# Patient Record
Sex: Male | Born: 1970 | Race: Black or African American | Hispanic: No | Smoking: Current every day smoker
Health system: Southern US, Community
[De-identification: ages and names within clinical notes are randomized; demographics above are authoritative.]

## PROBLEM LIST (undated history)

## (undated) HISTORY — PX: APPENDECTOMY: SHX54

---

## 2012-11-28 ENCOUNTER — Emergency Department: Payer: Self-pay | Admitting: Emergency Medicine

## 2012-11-30 ENCOUNTER — Ambulatory Visit: Payer: Self-pay | Admitting: Family Medicine

## 2012-12-01 ENCOUNTER — Ambulatory Visit: Payer: Self-pay | Admitting: Family Medicine

## 2012-12-01 IMAGING — CT CT CERVICAL SPINE WITHOUT CONTRAST
1 series · 12 of 14 positions shown, 15 images · non-contrast
Comparison: none

REASON FOR EXAM: CALL REPORT  [PHONE_NUMBER]  abn bone xray
COMMENTS:

PROCEDURE:     CT  - CT CERVICAL SPINE WO  - [DATE] [DATE]
RESULT:     CT cervical spine
TECHNIQUE: Helical 2 mm sections were obtained. Reconstructions were
performed utilizing a bone algorithm in coronal, sagittal, and axial planes.

[Series 6: axial · axial · 0.31mm/px · z∈[-165,-38]mm · 12 of 82 slices shown, 15 images]
[im 7/82  soft-tissue]
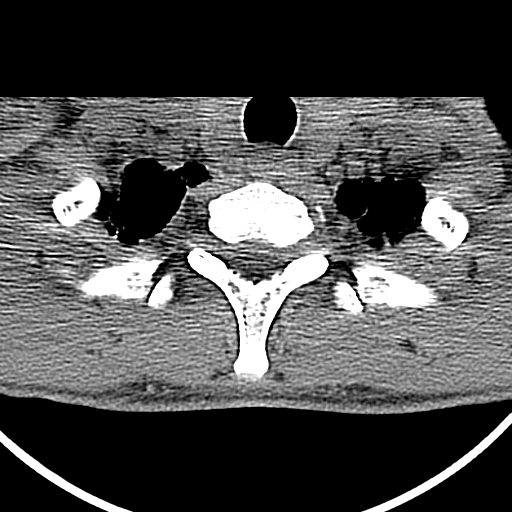
[im 7/82  bone]
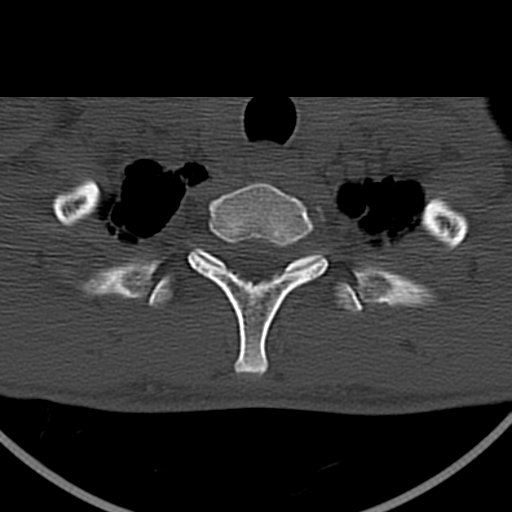
[im 13/82  bone]
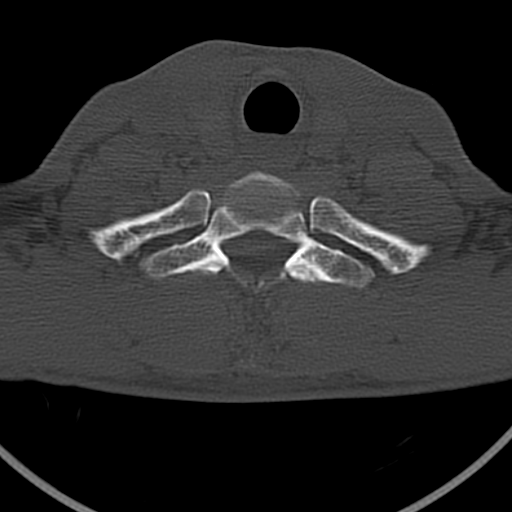
[im 19/82  bone]
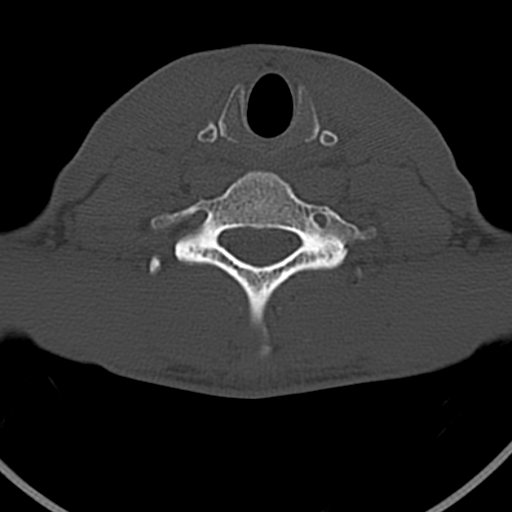
[im 25/82  bone]
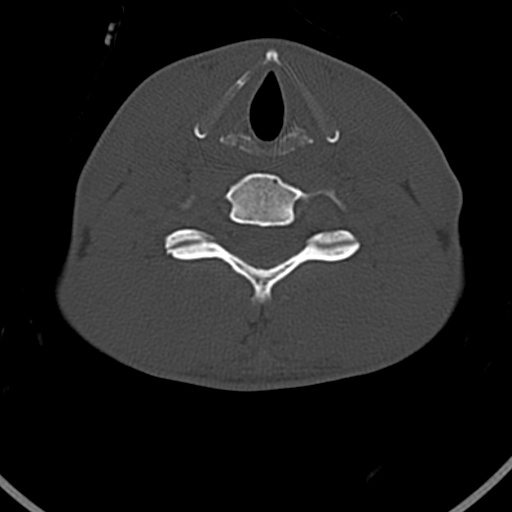
[im 32/82  soft-tissue]
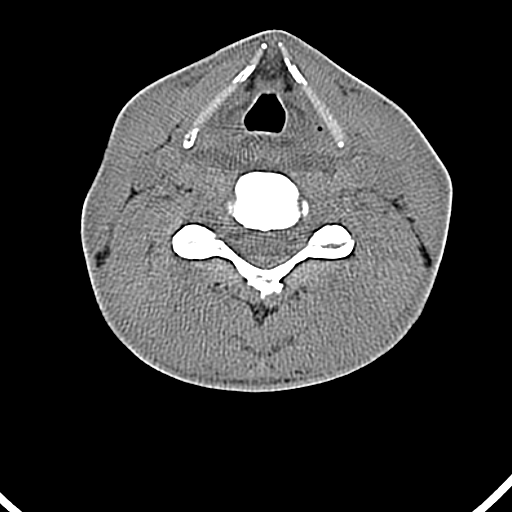
[im 32/82  bone]
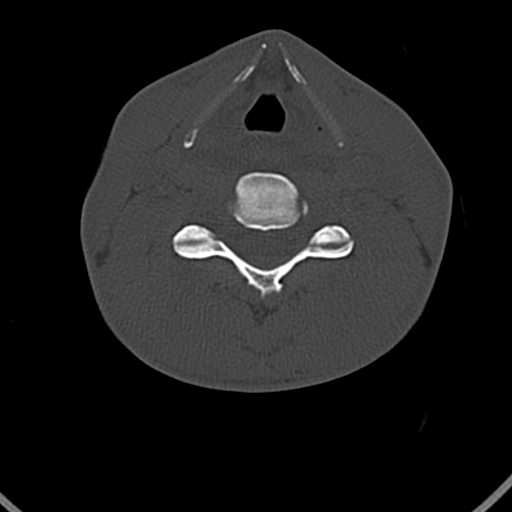
[im 38/82  bone]
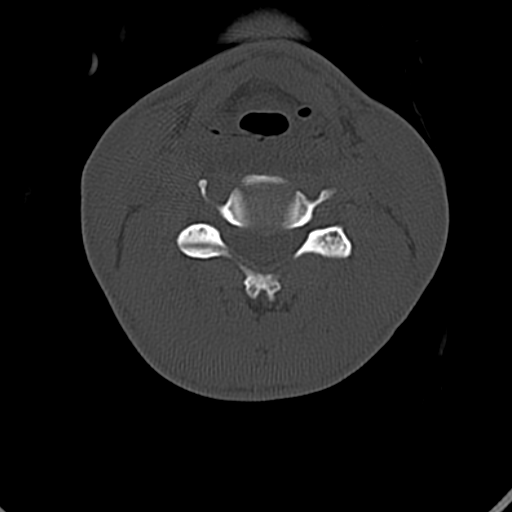
[im 44/82  bone]
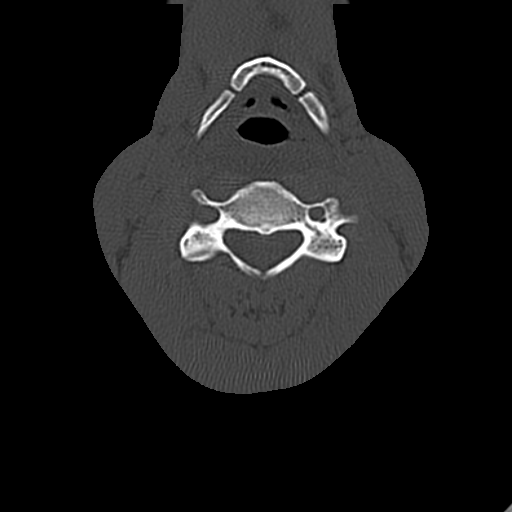
[im 50/82  bone]
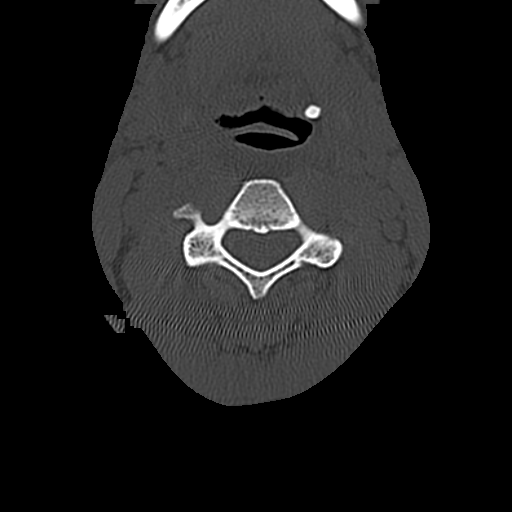
[im 57/82  soft-tissue]
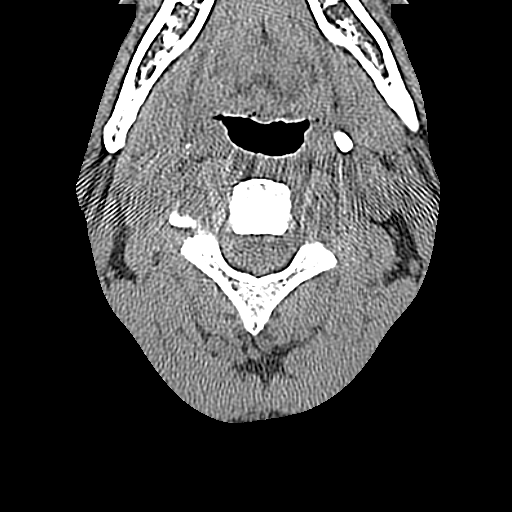
[im 57/82  bone]
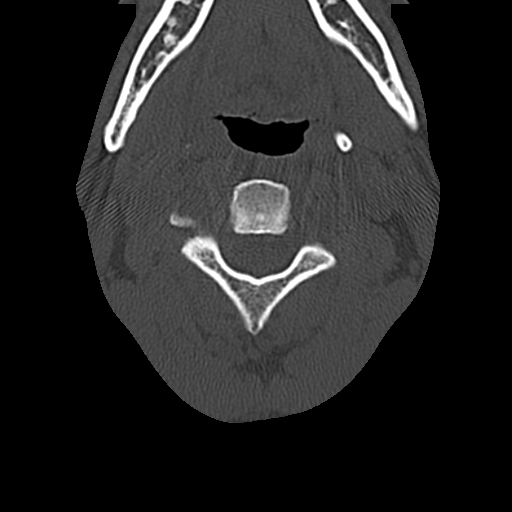
[im 63/82  bone]
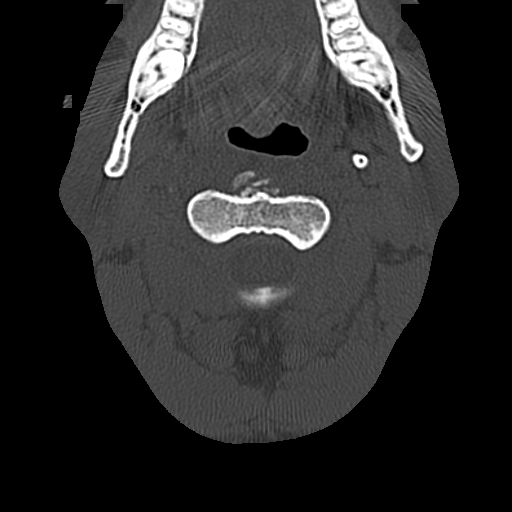
[im 69/82  bone]
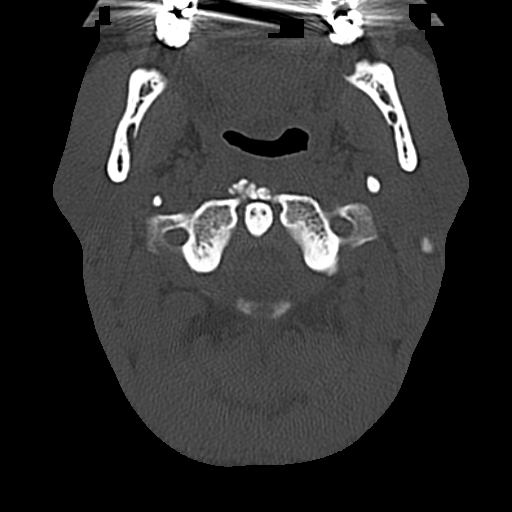
[im 75/82  bone]
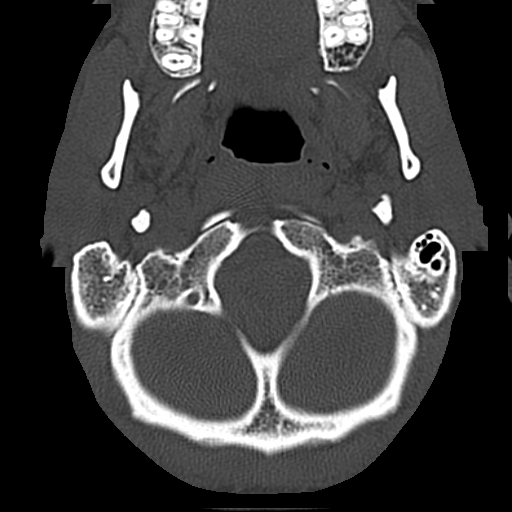

[12 of 14 positions shown; findings below may reference images not displayed]

FINDINGS: There is no evidence of acute fracture, dislocation, or
malalignment. Amorphous area of calcified density projects along the
anterior aspect of the dens just below the anterior ring of C1. This finding
has the appearance of acute calcific tendinitis of the longus coli
musculature. There appears to be mild edema anterior to this finding within
the soft tissues. There is no evidence of tracheal stenosis.
IMPRESSION: 1. Findings consistent with acute calcific tendinitis of the longus coli
musculature.
2. Otherwise no evidence of acute osseous abnormalities.

## 2012-12-10 ENCOUNTER — Encounter: Payer: Self-pay | Admitting: Family Medicine

## 2012-12-18 ENCOUNTER — Encounter: Payer: Self-pay | Admitting: Family Medicine

## 2014-11-01 LAB — URINALYSIS, COMPLETE
Bacteria: NONE SEEN
Bilirubin,UR: NEGATIVE
Blood: NEGATIVE
Glucose,UR: NEGATIVE mg/dL (ref 0–75)
LEUKOCYTE ESTERASE: NEGATIVE
NITRITE: NEGATIVE
PH: 6 (ref 4.5–8.0)
PROTEIN: NEGATIVE
RBC,UR: 1 /HPF (ref 0–5)
SQUAMOUS EPITHELIAL: NONE SEEN
Specific Gravity: 1.025 (ref 1.003–1.030)

## 2014-11-01 LAB — COMPREHENSIVE METABOLIC PANEL
ALBUMIN: 3.8 g/dL (ref 3.4–5.0)
AST: 21 U/L (ref 15–37)
Alkaline Phosphatase: 71 U/L
Anion Gap: 6 — ABNORMAL LOW (ref 7–16)
BUN: 10 mg/dL (ref 7–18)
Bilirubin,Total: 0.6 mg/dL (ref 0.2–1.0)
CREATININE: 1.12 mg/dL (ref 0.60–1.30)
Calcium, Total: 8.6 mg/dL (ref 8.5–10.1)
Chloride: 106 mmol/L (ref 98–107)
Co2: 26 mmol/L (ref 21–32)
EGFR (African American): >60
EGFR (Non-African Amer.): >60
GLUCOSE: 99 mg/dL (ref 65–99)
Osmolality: 275 (ref 275–301)
POTASSIUM: 4.1 mmol/L (ref 3.5–5.1)
SGPT (ALT): 13 U/L — ABNORMAL LOW
SODIUM: 138 mmol/L (ref 136–145)
TOTAL PROTEIN: 7.1 g/dL (ref 6.4–8.2)

## 2014-11-01 LAB — CBC WITH DIFFERENTIAL/PLATELET
Basophil #: 0.1 10*3/uL (ref 0.0–0.1)
Basophil %: 0.6 %
EOS ABS: 0 10*3/uL (ref 0.0–0.7)
Eosinophil %: 0.2 %
HCT: 44.9 % (ref 40.0–52.0)
HGB: 14.3 g/dL (ref 13.0–18.0)
LYMPHS ABS: 1.1 10*3/uL (ref 1.0–3.6)
Lymphocyte %: 6.7 %
MCH: 27.8 pg (ref 26.0–34.0)
MCHC: 31.9 g/dL — ABNORMAL LOW (ref 32.0–36.0)
MCV: 87 fL (ref 80–100)
Monocyte #: 0.8 x10 3/mm (ref 0.2–1.0)
Monocyte %: 5.2 %
NEUTROS ABS: 14.4 10*3/uL — AB (ref 1.4–6.5)
Neutrophil %: 87.3 %
Platelet: 162 10*3/uL (ref 150–440)
RBC: 5.14 10*6/uL (ref 4.40–5.90)
RDW: 14.2 % (ref 11.5–14.5)
WBC: 16.5 10*3/uL — ABNORMAL HIGH (ref 3.8–10.6)

## 2014-11-01 LAB — LIPASE, BLOOD: Lipase: 52 U/L — ABNORMAL LOW (ref 73–393)

## 2014-11-02 ENCOUNTER — Observation Stay: Payer: Self-pay | Admitting: Emergency Medicine

## 2015-02-12 LAB — SURGICAL PATHOLOGY

## 2015-02-18 NOTE — H&P (Signed)
   Subjective/Chief Complaint RLQ pain, N/V x 1 day   History of Present Illness 44 yo M who presents with a 1 day history of RLQ pain.  Began acutely, awoke from sleep.  Worsening.  + nausea/vomiting.  + fevers/chills.  Doing well prior to this.  Has never had this pain before.   Code Status Full Code   Past Med/Surgical Hx:  Denies medical history:   Denies surgical history.:   ALLERGIES:  No Known Allergies:   Family and Social History:  Family History Negative   Social History negative tobacco, negative ETOH, negative Illicit drugs   Place of Living Home   Review of Systems:  Subjective/Chief Complaint RLQ pain, N/V.   Fever/Chills Yes   Cough No   Sputum No   Abdominal Pain Yes   Diarrhea No   Constipation No   Nausea/Vomiting Yes   SOB/DOE No   Dysuria No   Tolerating Diet No  Nauseated  Vomiting   Physical Exam:  GEN well developed, well nourished, no acute distress   HEENT pink conjunctivae, PERRL, hearing intact to voice, good dentition   RESP normal resp effort  clear BS   CARD regular rate  no murmur  no thrills   ABD positive tenderness  no hernia  soft  rigid  distended  normal BS  no Abdominal Bruits  no Adominal Mass   EXTR negative cyanosis/clubbing, negative edema   SKIN normal to palpation, No rashes, skin turgor good   NEURO cranial nerves intact, follows commands, strength:, motor/sensory function intact   PSYCH A+O to time, place, person, good insight    Assessment/Admission Diagnosis 44 yo with RLQ pain, N/V.  WBC 16.  CT scan concerning for appendicitis.   Plan NPO, IVF, IV abx.  To or for lap appendectomy   Electronic Signatures: Jarvis NewcomerLundquist, Herman Fiero A (MD)  (Signed 13-Jan-16 20:41)  Authored: CHIEF COMPLAINT and HISTORY, PAST MEDICAL/SURGIAL HISTORY, ALLERGIES, FAMILY AND SOCIAL HISTORY, REVIEW OF SYSTEMS, PHYSICAL EXAM, ASSESSMENT AND PLAN   Last Updated: 13-Jan-16 20:41 by Jarvis NewcomerLundquist, Jamaris Theard A (MD)

## 2015-02-18 NOTE — Op Note (Signed)
PATIENT NAME:  Johnathan Dennis, Johnathan Dennis DATE OF BIRTH:  03/31/1971  DATE OF PROCEDURE:  11/01/2014  PREOPERATIVE DIAGNOSIS: Acute appendicitis.   POSTOPERATIVE DIAGNOSIS: Acute appendicitis.   PROCEDURE PERFORMED: Laparoscopic appendectomy.   ANESTHESIA: General.   ESTIMATED BLOOD LOSS: 10 mL   COMPLICATIONS: None.   SPECIMENS: Appendix.   INDICATION FOR SURGERY: Johnathan Dennis is a pleasant male with history of right lower quadrant pain, leukocytosis and a CT scan concerning for appendicitis. He was thus brought to the operating room for laparoscopic appendectomy.   DETAILS OF PROCEDURE: Obtained. Johnathan Dennis was brought to the operating room suite. He was induced. Endotracheal tube was placed, general anesthesia was administered. His abdomen was prepped and draped in standard surgical fashion. A timeout was then performed correctly identifying patient name, operative site and procedure to be formed.   A supraumbilical incision was made. It was deepened down to the fascia. The fascia was incised. The peritoneum was entered. Two stay sutures were placed through the fasciotomy. A left lower quadrant and suprapubic 5 mm trocars were placed. The appendix was visualized. It was stuck to the abdominal wall and appeared to be inflamed and enlarged. Defect was made in the mesoappendix. The stapler was then fired across the base of the mesoappendix flush with the cecum. The stapler was then fired across the mesoappendix. The appendix was then taken out  with an EndoCatch bag through the umbilicus. The abdomen was irrigated.   Hemostasis was obtained. The trocars were then taken out. The abdomen was desufflated. The supraumbilical fascia was closed with figure-of-eight 0 Vicryl. The skin was then closed with 4-0 Monocryl deep dermal sutures. Steri-Strips, Telfa gauze and Tegaderm were then used to complete the dressing.   The patient was then awoken, extubated and brought to the  postanesthesia care unit. There were no immediate complications.   Needle, sponge, and instrument counts were correct at the end of the procedure.     ____________________________ Si Raiderhristopher A. Madell Heino, MD cal:ST D: 11/06/2014 15:26:45 ET T: 11/06/2014 15:59:38 ET JOB#: [id number]  cc: Cristal Deerhristopher A. Hulbert Branscome, MD, <Dictator> Jarvis NewcomerHRISTOPHER A Kellan Boehlke MD ELECTRONICALLY SIGNED 11/08/2014 8:43

## 2015-06-14 ENCOUNTER — Encounter: Payer: Self-pay | Admitting: Family Medicine

## 2017-06-20 ENCOUNTER — Encounter: Payer: Self-pay | Admitting: Emergency Medicine

## 2017-06-20 ENCOUNTER — Emergency Department
Admission: EM | Admit: 2017-06-20 | Discharge: 2017-06-20 | Disposition: A | Discharge status: Home / Self Care | Payer: Self-pay | Attending: Student in an Organized Health Care Education/Training Program | Admitting: Student in an Organized Health Care Education/Training Program

## 2017-06-20 ENCOUNTER — Emergency Department: Payer: Self-pay

## 2017-06-20 DIAGNOSIS — Y999 Unspecified external cause status: Secondary | ICD-10-CM | POA: Insufficient documentation

## 2017-06-20 DIAGNOSIS — S60012A Contusion of left thumb without damage to nail, initial encounter: Secondary | ICD-10-CM | POA: Insufficient documentation

## 2017-06-20 DIAGNOSIS — W230XXA Caught, crushed, jammed, or pinched between moving objects, initial encounter: Secondary | ICD-10-CM | POA: Insufficient documentation

## 2017-06-20 DIAGNOSIS — Y939 Activity, unspecified: Secondary | ICD-10-CM | POA: Insufficient documentation

## 2017-06-20 DIAGNOSIS — Y929 Unspecified place or not applicable: Secondary | ICD-10-CM | POA: Insufficient documentation

## 2017-06-20 DIAGNOSIS — F17210 Nicotine dependence, cigarettes, uncomplicated: Secondary | ICD-10-CM | POA: Insufficient documentation

## 2017-06-20 MED ORDER — IBUPROFEN 800 MG PO TABS: 800.0000 mg | ORAL_TABLET | Freq: Three times a day (TID) | ORAL | 0 refills | Status: DC | PRN

## 2017-06-20 MED ORDER — IBUPROFEN 800 MG PO TABS
800.0000 mg | ORAL_TABLET | Freq: Once | ORAL | Status: AC
  Administered 2017-06-20: 800 mg via ORAL
  Filled 2017-06-20: qty 1

## 2017-06-20 NOTE — ED Notes (Signed)
PA at the bedside for pt evaluation 

## 2017-06-20 NOTE — ED Notes (Signed)

## 2017-06-20 NOTE — ED Triage Notes (Signed)
Pt says he slammed the end of his left thumb in the car door around 10am today; some bruising under nailbed; pt says finger is throbbing and burning;

## 2017-06-20 NOTE — Discharge Instructions (Signed)
Utilize splint to protect thumb until pain improves.  Take ibuprofen for pain and inflammation as directed.

## 2017-06-20 NOTE — ED Notes (Signed)
X-ray at the beside.

## 2017-06-20 NOTE — ED Provider Notes (Signed)
[facility] Emergency Department Provider Note   ____________________________________________   I have reviewed the triage vital signs and the nursing notes.   HISTORY  Chief Complaint Finger Injury    HPI Johnathan Dennis is a 46 y.o. male presents to emergency department with left thumb pain after slamming his thumb in [employer] [place] earlier this today. Patient reports swelling, pain along the distal phalanx of the left thumb. Patient demonstrates movement and strength along the left thumb. No deformities or open wounds noted. Patient denies fever, chills, headache, vision changes, chest pain, chest tightness, shortness of breath, abdominal pain, nausea and vomiting.  History reviewed. No pertinent past medical history.  There are no active problems to display for this patient.   History reviewed. No pertinent surgical history.  Prior to Admission medications   Medication Sig Start Date End Date Taking? Authorizing Provider  ibuprofen (ADVIL,MOTRIN) 800 MG tablet Take 1 tablet (800 mg total) by mouth every 8 (eight) hours as needed. 06/20/17   Royale Swamy M, PA-C    Allergies Patient has no known allergies.  History reviewed. No pertinent family history.  Social History Social History  Substance Use Topics  . Smoking status: Current Every Day Smoker    Packs/day: 1.00    Types: Cigarettes  . Smokeless tobacco: Never Used  . Alcohol use No    Review of Systems Constitutional: Negative for fever/chills Eyes: No visual changes. ENT:  Negative for sore throat and for difficulty swallowing Cardiovascular: Denies chest pain. Respiratory: Denies cough. Denies shortness of breath. Gastrointestinal: No abdominal pain.  No nausea, vomiting, diarrhea. Genitourinary: Negative for dysuria. Musculoskeletal: Positive for thumb pain. Skin: Negative for rash. Neurological: Negative for headaches.  Negative focal weakness or numbness. Negative  for loss of consciousness. Able to ambulate. ____________________________________________   PHYSICAL EXAM:  VITAL SIGNS: ED Triage Vitals  Enc Vitals Group     BP 06/20/17 2021 122/83     Pulse Rate 06/20/17 2021 66     Resp 06/20/17 2021 18     Temp 06/20/17 2021 97.8 F (36.6 C)     Temp Source 06/20/17 2021 Oral     SpO2 06/20/17 2021 99 %     Weight 06/20/17 2022 135 lb (61.2 kg)     Height 06/20/17 2022 5\' 7"  (1.702 m)     Head Circumference --      Peak Flow --      Pain Score 06/20/17 2020 7     Pain Loc --      Pain Edu? --      Excl. in GC? --     Constitutional: Alert and oriented. Well appearing and in no acute distress.  Eyes: Conjunctivae are normal. PERRL. EOMI  Head: Normocephalic and atraumatic. ENT:      Ears: Canals clear. TMs intact bilaterally.      Nose: No congestion/rhinnorhea.      Mouth/Throat: Mucous membranes are moist. Neck:Supple. No thyromegaly. No stridor.  Cardiovascular: Normal rate, regular rhythm. Normal S1 and S2.  Good peripheral circulation. Respiratory: Normal respiratory effort without tachypnea or retractions. Lungs CTAB. No wheezes/rales/rhonchi. Good air entry to the bases with no decreased or absent breath sounds. Hematological/Lymphatic/Immunological: No cervical lymphadenopathy. Cardiovascular: Normal rate, regular rhythm. Normal distal pulses. Gastrointestinal: Bowel sounds 4 quadrants. Soft and nontender to palpation. No guarding or rigidity. No palpable masses. No distention. No CVA tenderness. Musculoskeletal: Left thumb pain along distal phalanx. Intact ROM and tendons. Intact sensation.  Neurologic:  Normal speech and language.  Skin:  Skin is warm, dry and intact. No rash noted. Psychiatric: Mood and affect are normal. Speech and behavior are normal. Patient exhibits appropriate insight and judgement.  ____________________________________________   LABS (all labs ordered are listed, but only abnormal results are  displayed)  Labs Reviewed - No data to display ____________________________________________  EKG none ____________________________________________  RADIOLOGY DG hand complete FINDINGS: There is no evidence of fracture or dislocation. There is no evidence of arthropathy or other focal bone abnormality. Soft tissues are unremarkable.  IMPRESSION: Negative. ____________________________________________   PROCEDURES  Procedure(s) performed:  Applied splint to thumb for support with tape.     Critical Care performed: no ____________________________________________   INITIAL IMPRESSION / ASSESSMENT AND PLAN / ED COURSE  Pertinent labs & imaging results that were available during my care of the patient were reviewed by me and considered in my medical decision making (see chart for details).  Patient presented with left thumb pain. Patient history, physical exam findings and imaging are reassuring of no acute fracture or neurovascular injury. Symptoms consistent with left thumb contusion. Applied splint for comfort and advised to remove with symptoms improved. Patient advised to follow up with PCP as needed and was also advised to return to the emergency department for symptoms that change or worsen. Patient informed of clinical course, understand medical decision-making process, and agree with plan. ____________________________________________   FINAL CLINICAL IMPRESSION(S) / ED DIAGNOSES  Final diagnoses:  Contusion of left thumb without damage to nail, initial encounter       NEW MEDICATIONS STARTED DURING THIS VISIT:  New Prescriptions   IBUPROFEN (ADVIL,MOTRIN) 800 MG TABLET    Take 1 tablet (800 mg total) by mouth every 8 (eight) hours as needed.     Note:  This document was prepared using Dragon voice recognition software and may include unintentional dictation errors.    Percell Boston 06/20/17 2142    Willy Eddy, MD 06/20/17 2147

## 2018-05-28 ENCOUNTER — Emergency Department: Payer: 59

## 2018-05-28 ENCOUNTER — Other Ambulatory Visit: Payer: Self-pay

## 2018-05-28 ENCOUNTER — Emergency Department
Admission: EM | Admit: 2018-05-28 | Discharge: 2018-05-29 | Disposition: A | Discharge status: Home / Self Care | Payer: 59 | Attending: Emergency Medicine | Admitting: Emergency Medicine

## 2018-05-28 DIAGNOSIS — R0789 Other chest pain: Secondary | ICD-10-CM | POA: Insufficient documentation

## 2018-05-28 DIAGNOSIS — F17210 Nicotine dependence, cigarettes, uncomplicated: Secondary | ICD-10-CM | POA: Diagnosis not present

## 2018-05-28 DIAGNOSIS — R079 Chest pain, unspecified: Secondary | ICD-10-CM | POA: Diagnosis present

## 2018-05-28 LAB — CBC
HEMATOCRIT: 44.2 % (ref 40.0–52.0)
Hemoglobin: 14.9 g/dL (ref 13.0–18.0)
MCH: 28.8 pg (ref 26.0–34.0)
MCHC: 33.8 g/dL (ref 32.0–36.0)
MCV: 85.3 fL (ref 80.0–100.0)
PLATELETS: 179 10*3/uL (ref 150–440)
RBC: 5.19 MIL/uL (ref 4.40–5.90)
RDW: 13.7 % (ref 11.5–14.5)
WBC: 6 10*3/uL (ref 3.8–10.6)

## 2018-05-28 LAB — BASIC METABOLIC PANEL
Anion gap: 8 (ref 5–15)
BUN: 11 mg/dL (ref 6–20)
CO2: 27 mmol/L (ref 22–32)
Calcium: 9 mg/dL (ref 8.9–10.3)
Chloride: 102 mmol/L (ref 98–111)
Creatinine, Ser: 1.41 mg/dL — ABNORMAL HIGH (ref 0.61–1.24)
GFR, EST NON AFRICAN AMERICAN: 59 mL/min — AB (ref >60)
Glucose, Bld: 106 mg/dL — ABNORMAL HIGH (ref 70–99)
POTASSIUM: 3.9 mmol/L (ref 3.5–5.1)
SODIUM: 137 mmol/L (ref 135–145)

## 2018-05-28 LAB — TROPONIN I: Troponin I: <0.03 ng/mL (ref <0.03)

## 2018-05-28 NOTE — ED Notes (Signed)
ED Provider at bedside. 

## 2018-05-28 NOTE — ED Provider Notes (Signed)
[facility] Emergency Department Provider Note  ____________________________________________   First MD Initiated Contact with Patient 05/28/18 2346     (approximate)  I have reviewed the triage vital signs and the nursing notes.   HISTORY  Chief Complaint Chest Pain    HPI Humzah Ashok CroonJ Hare is a 47 y.o. male who self presents the emergency department with 3 days of chest pain.  Pain is sharp and intermittent and he feels like it is "fluttering" in his left chest.  Pain last 2 to 3 seconds at a time.  No shortness of breath.  No nausea vomiting.  Nonexertional.  No history of DVT or pulmonary embolism.  No leg swelling.  No recent surgery travel or immobilization.  Pain is not ripping or tearing does not go straight to his back.    No past medical history on file.  There are no active problems to display for this patient.   No past surgical history on file.  Prior to Admission medications   Medication Sig Start Date End Date Taking? Authorizing Provider  ibuprofen (ADVIL,MOTRIN) 800 MG tablet Take 1 tablet (800 mg total) by mouth every 8 (eight) hours as needed. 06/20/17   Little, Traci M, PA-C    Allergies Patient has no known allergies.  No family history on file.  Social History Social History   Tobacco Use  . Smoking status: Current Every Day Smoker    Packs/day: 1.00    Types: Cigarettes  . Smokeless tobacco: Never Used  Substance Use Topics  . Alcohol use: No  . Drug use: No    Review of Systems Constitutional: No fever/chills Eyes: No visual changes. ENT: No sore throat. Cardiovascular: Positive for chest pain. Respiratory: Denies shortness of breath. Gastrointestinal: No abdominal pain.  No nausea, no vomiting.  No diarrhea.  No constipation. Genitourinary: Negative for dysuria. Musculoskeletal: Negative for back pain. Skin: Negative for rash. Neurological: Negative for headaches, focal weakness or  numbness.   ____________________________________________   PHYSICAL EXAM:  VITAL SIGNS: ED Triage Vitals  Enc Vitals Group     BP 05/28/18 2229 (!) 133/98     Pulse Rate 05/28/18 2229 77     Resp 05/28/18 2229 17     Temp 05/28/18 2229 97.6 F (36.4 C)     Temp Source 05/28/18 2229 Oral     SpO2 05/28/18 2229 100 %     Weight 05/28/18 2230 137 lb (62.1 kg)     Height 05/28/18 2230 5\' 7"  (1.702 m)     Head Circumference --      Peak Flow --      Pain Score 05/28/18 2230 0     Pain Loc --      Pain Edu? --      Excl. in GC? --     Constitutional: Alert and oriented x4 mildly anxious appearing but nontoxic no diaphoresis speaks focally sentences Eyes: PERRL EOMI. Head: Atraumatic. Nose: No congestion/rhinnorhea. Mouth/Throat: No trismus Neck: No stridor.  Able to lie completely flat Cardiovascular: Normal rate, regular rhythm. Grossly normal heart sounds.  Good peripheral circulation. Respiratory: Normal respiratory effort.  No retractions. Lungs CTAB and moving good air Gastrointestinal: Soft nontender Musculoskeletal: No lower extremity edema is equal in size Neurologic:  Normal speech and language. No gross focal neurologic deficits are appreciated. Skin:  Skin is warm, dry and intact. No rash noted. Psychiatric: Mildly anxious appearing   ____________________________________________   DIFFERENTIAL includes but not limited to  Acute coronary  syndrome, pulmonary embolism, pericarditis, myocarditis, arrhythmia ____________________________________________   LABS (all labs ordered are listed, but only abnormal results are displayed)  Labs Reviewed  BASIC METABOLIC PANEL - Abnormal; Notable for the following components:      Result Value   Glucose, Bld 106 (*)    Creatinine, Ser 1.41 (*)    GFR calc non Af Amer 59 (*)    All other components within normal limits  CBC  TROPONIN I    Lab work reviewed by me with some chronic kidney disease but otherwise  unremarkable __________________________________________  EKG  ED ECG REPORT I, Merrily Brittle, the attending physician, personally viewed and interpreted this ECG.  Date: 05/30/2018 EKG Time:  Rate: 55 Rhythm: Sinus bradycardia QRS Axis: normal Intervals: normal ST/T Wave abnormalities: normal Narrative Interpretation: no evidence of acute ischemia  ____________________________________________  RADIOLOGY  Chest x-ray reviewed by me with no acute disease ____________________________________________   PROCEDURES  Procedure(s) performed: no  Procedures  Critical Care performed: no  ____________________________________________   INITIAL IMPRESSION / ASSESSMENT AND PLAN / ED COURSE  Pertinent labs & imaging results that were available during my care of the patient were reviewed by me and considered in my medical decision making (see chart for details).   As part of my medical decision making, I reviewed the following data within the electronic MEDICAL RECORD NUMBER History obtained from family if available, nursing notes, old chart and ekg, as well as notes from prior ED visits.  The patient has very atypical chest pain with normal EKG and chest x-ray.  He feels significant reassurance.  He is PERC negative.  He was kept on monitor over an hour with no ectopy.  I had a lengthy discussion with patient regarding the diagnostic uncertainty and importance of establishing care with primary care physician.  He is discharged home in stable condition verbalizes understanding agreement plan.      ____________________________________________   FINAL CLINICAL IMPRESSION(S) / ED DIAGNOSES  Final diagnoses:  Atypical chest pain      NEW MEDICATIONS STARTED DURING THIS VISIT:  Discharge Medication List as of 05/29/2018 12:00 AM       Note:  This document was prepared using Dragon voice recognition software and may include unintentional dictation errors.     Merrily Brittle, MD 05/30/18 [phone]

## 2018-05-28 NOTE — ED Triage Notes (Signed)
Pt c/o chest pain x 3 days. Pt states pain is intermittent and it feels like "a flutter" on the L chest. Pt states sometimes he feels like he needs to belch but cannot. Pt denies shortness of breath and n/v. Pt denies pain radiation.

## 2018-05-29 NOTE — Discharge Instructions (Signed)
Please make sure you remain well-hydrated and follow-up with primary care within 1 week for recheck.  Please stop drinking so much sweet tea because it will continue to dehydrate you and can affect your long-term.  Return to the emergency department for any issues whatsoever.  It was a pleasure to take care of you today, and thank you for coming to our emergency department.  If you have any questions or concerns before leaving please ask the nurse to grab me and I'm more than happy to go through your aftercare instructions again.  If you were prescribed any opioid pain medication today such as Norco, Vicodin, Percocet, morphine, hydrocodone, or oxycodone please make sure you do not drive when you are taking this medication as it can alter your ability to drive safely.  If you have any concerns once you are home that you are not improving or are in fact getting worse before you can make it to your follow-up appointment, please do not hesitate to call 911 and come back for further evaluation.  Merrily BrittleNeil Jermon Chalfant, MD  Results for orders placed or performed during the hospital encounter of 05/28/18  Basic metabolic panel  Result Value Ref Range   Sodium 137 135 - 145 mmol/L   Potassium 3.9 3.5 - 5.1 mmol/L   Chloride 102 98 - 111 mmol/L   CO2 27 22 - 32 mmol/L   Glucose, Bld 106 (H) 70 - 99 mg/dL   BUN 11 6 - 20 mg/dL   Creatinine, Ser 1.611.41 (H) 0.61 - 1.24 mg/dL   Calcium 9.0 8.9 - 09.610.3 mg/dL   GFR calc non Af Amer 59 (L) >60 mL/min   GFR calc Af Amer >60 >60 mL/min   Anion gap 8 5 - 15  CBC  Result Value Ref Range   WBC 6.0 3.8 - 10.6 K/uL   RBC 5.19 4.40 - 5.90 MIL/uL   Hemoglobin 14.9 13.0 - 18.0 g/dL   HCT 04.544.2 40.940.0 - 81.152.0 %   MCV 85.3 80.0 - 100.0 fL   MCH 28.8 26.0 - 34.0 pg   MCHC 33.8 32.0 - 36.0 g/dL   RDW 91.413.7 78.211.5 - 95.614.5 %   Platelets 179 150 - 440 K/uL  Troponin I  Result Value Ref Range   Troponin I <0.03 <0.03 ng/mL   Dg Chest 2 View  Result Date: 05/28/2018 CLINICAL  DATA:  Chest pain x3 days. EXAM: CHEST - 2 VIEW COMPARISON:  None. FINDINGS: The heart size and mediastinal contours are within normal limits. Both lungs are clear. No pulmonary consolidation, effusion or pneumothorax. Apical pleuroparenchymal scarring is seen bilaterally. The visualized skeletal structures are unremarkable. IMPRESSION: No active cardiopulmonary disease. Electronically Signed   By: Tollie Ethavid  Kwon M.D.   On: 05/28/2018 22:49

## 2019-12-31 ENCOUNTER — Other Ambulatory Visit: Payer: Self-pay

## 2019-12-31 ENCOUNTER — Ambulatory Visit: Payer: Self-pay | Attending: Internal Medicine

## 2019-12-31 DIAGNOSIS — Z23 Encounter for immunization: Secondary | ICD-10-CM

## 2019-12-31 NOTE — Progress Notes (Signed)
   Covid-19 Vaccination Clinic  Name:  Johnathan Dennis    MRN: [id number] DOB: Mar 08, 1971  12/31/2019  Mr. [name] was observed post Covid-19 immunization for 15 minutes without incident. He was provided with Vaccine Information Sheet and instruction to access the V-Safe system.   [name] was instructed to call 911 with any severe reactions post vaccine: Marland Kitchen Difficulty breathing  . Swelling of face and throat  . A fast heartbeat  . A bad rash all over body  . Dizziness and weakness   Immunizations Administered    Name Date Dose VIS Date Route   Pfizer COVID-19 Vaccine 12/31/2019 12:23 PM 0.3 mL 09/30/2019 Intramuscular   Manufacturer: [employer], [employer]   Lot: [id number]   NDC: [address]-2

## 2020-01-25 ENCOUNTER — Ambulatory Visit: Payer: Self-pay | Attending: Internal Medicine

## 2020-01-25 DIAGNOSIS — Z23 Encounter for immunization: Secondary | ICD-10-CM

## 2020-01-25 NOTE — Progress Notes (Signed)
   Covid-19 Vaccination Clinic  Name:  RMANI KELLOGG    MRN: [id number] DOB: 05/16/1971  01/25/2020  Mr. Boulter was observed post Covid-19 immunization for 15 minutes without incident. He was provided with Vaccine Information Sheet and instruction to access the V-Safe system.   Mr. Trejos was instructed to call 911 with any severe reactions post vaccine: Marland Kitchen Difficulty breathing  . Swelling of face and throat  . A fast heartbeat  . A bad rash all over body  . Dizziness and weakness   Immunizations Administered    Name Date Dose VIS Date Route   Pfizer COVID-19 Vaccine 01/25/2020 10:48 AM 0.3 mL 09/30/2019 Intramuscular   Manufacturer: [employer], [employer]   Lot: [id number]   NDC: [address]-9

## 2020-08-04 ENCOUNTER — Emergency Department: Payer: Managed Care, Other (non HMO)

## 2020-08-04 ENCOUNTER — Other Ambulatory Visit: Payer: Self-pay

## 2020-08-04 ENCOUNTER — Emergency Department
Admission: EM | Admit: 2020-08-04 | Discharge: 2020-08-04 | Disposition: A | Discharge status: Home / Self Care | Payer: Managed Care, Other (non HMO) | Attending: Emergency Medicine | Admitting: Emergency Medicine

## 2020-08-04 DIAGNOSIS — R6883 Chills (without fever): Secondary | ICD-10-CM | POA: Insufficient documentation

## 2020-08-04 DIAGNOSIS — F17210 Nicotine dependence, cigarettes, uncomplicated: Secondary | ICD-10-CM | POA: Insufficient documentation

## 2020-08-04 DIAGNOSIS — J00 Acute nasopharyngitis [common cold]: Secondary | ICD-10-CM | POA: Diagnosis not present

## 2020-08-04 DIAGNOSIS — K529 Noninfective gastroenteritis and colitis, unspecified: Secondary | ICD-10-CM | POA: Diagnosis not present

## 2020-08-04 DIAGNOSIS — R109 Unspecified abdominal pain: Secondary | ICD-10-CM | POA: Diagnosis present

## 2020-08-04 MED ORDER — CIPROFLOXACIN HCL 500 MG PO TABS
500.0000 mg | ORAL_TABLET | Freq: Once | ORAL | Status: AC
  Administered 2020-08-04: 500 mg via ORAL
  Filled 2020-08-04: qty 1

## 2020-08-04 MED ORDER — IOHEXOL 300 MG/ML  SOLN
100.0000 mL | Freq: Once | INTRAMUSCULAR | Status: AC | PRN
  Administered 2020-08-04: 100 mL via INTRAVENOUS

## 2020-08-04 MED ORDER — DICYCLOMINE HCL 10 MG PO CAPS
10.0000 mg | ORAL_CAPSULE | Freq: Once | ORAL | Status: AC
  Administered 2020-08-04: 10 mg via ORAL
  Filled 2020-08-04: qty 1

## 2020-08-04 MED ORDER — DICYCLOMINE HCL 10 MG PO CAPS: 10.0000 mg | ORAL_CAPSULE | Freq: Four times a day (QID) | ORAL | 0 refills | Status: DC | PRN

## 2020-08-04 MED ORDER — ONDANSETRON 4 MG PO TBDP: 4.0000 mg | ORAL_TABLET | Freq: Once | ORAL | Status: DC

## 2020-08-04 MED ORDER — METRONIDAZOLE 500 MG PO TABS
500.0000 mg | ORAL_TABLET | Freq: Three times a day (TID) | ORAL | 0 refills | Status: AC
Start: 2020-08-04 — End: 2020-08-14

## 2020-08-04 MED ORDER — METRONIDAZOLE 500 MG PO TABS
500.0000 mg | ORAL_TABLET | Freq: Once | ORAL | Status: AC
  Administered 2020-08-04: 500 mg via ORAL
  Filled 2020-08-04: qty 1

## 2020-08-04 MED ORDER — CIPROFLOXACIN HCL 500 MG PO TABS: 500.0000 mg | ORAL_TABLET | Freq: Two times a day (BID) | ORAL | 0 refills | Status: AC

## 2020-08-04 NOTE — ED Triage Notes (Signed)
Pt over by [facility] with elevated WBC of 18.7.

## 2020-08-04 NOTE — Discharge Instructions (Signed)
Please follow-up with primary care for symptoms that are not improving over the next few days.  Return to the emergency department for symptoms that change or worsen if unable to schedule appointment.

## 2020-08-04 NOTE — ED Notes (Signed)
Pt is in stretcher, NAD. Someone else is in stretcher with pt.

## 2020-08-04 NOTE — ED Triage Notes (Signed)
Pt from [facility] after getting a result of 18.7 in his WBCs- pt was seen for abdominal pain at [facility] with cold chills- pt states the abdominal pain is still there but not as bad- pt did have nausea but denies v/d

## 2020-08-04 NOTE — ED Notes (Signed)
Spoke with Dr Larinda Buttery who reviewed labs from [facility]- no orders for additional bloodwork

## 2020-08-05 NOTE — ED Provider Notes (Signed)
[facility] Emergency Department Provider Note ____________________________________________   First MD Initiated Contact with Patient 08/04/20 1804     (approximate)  I have reviewed the triage vital signs and the nursing notes.   HISTORY  Chief Complaint Abnormal Lab  HPI Johnathan Dennis is a 49 y.o. male with no chronic medical history presents to the emergency department for treatment and evaluation after abdominal pain with leukocytosis sent from [facility] for further evaluation. Abdominal pain started 2 days ago and has been associated with cold chills and nausea. Pain is less now than when he was at [facility]. No vomiting or diarrhea. Previous appendectomy otherwise no other abdominal surgery or history.    History reviewed. No pertinent past medical history.  There are no problems to display for this patient.   History reviewed. No pertinent surgical history.  Prior to Admission medications   Medication Sig Start Date End Date Taking? Authorizing Provider  ciprofloxacin (CIPRO) 500 MG tablet Take 1 tablet (500 mg total) by mouth 2 (two) times daily for 10 days. 08/04/20 08/14/20  Jafari Mckillop, Kasandra Knudsen, FNP  dicyclomine (BENTYL) 10 MG capsule Take 1 capsule (10 mg total) by mouth 4 (four) times daily as needed for up to 14 days for spasms. 08/04/20 08/18/20  Judeen Geralds, Rulon Eisenmenger B, FNP  ibuprofen (ADVIL,MOTRIN) 800 MG tablet Take 1 tablet (800 mg total) by mouth every 8 (eight) hours as needed. 06/20/17   Little, Traci M, PA-C  metroNIDAZOLE (FLAGYL) 500 MG tablet Take 1 tablet (500 mg total) by mouth 3 (three) times daily for 10 days. 08/04/20 08/14/20  Chinita Pester, FNP    Allergies Patient has no known allergies.  No family history on file.  Social History Social History   Tobacco Use  . Smoking status: Current Every Day Smoker    Packs/day: 1.00    Types: Cigarettes  . Smokeless tobacco: Never Used  Substance Use Topics  . Alcohol use: No  . Drug use: No     Review of Systems  Constitutional: No fever/chills Eyes: No visual changes. ENT: No sore throat. Cardiovascular: Denies chest pain. Respiratory: Denies shortness of breath. Gastrointestinal: Positive for abdominal pain.  Positive for nausea, no vomiting.  No diarrhea.  No constipation. Genitourinary: Negative for dysuria. Musculoskeletal: Negative for back pain. Skin: Negative for rash. Neurological: Negative for headaches, focal weakness or numbness. ____________________________________________   PHYSICAL EXAM:  VITAL SIGNS: ED Triage Vitals  Enc Vitals Group     BP 08/04/20 1350 113/77     Pulse Rate 08/04/20 1350 68     Resp 08/04/20 1350 16     Temp 08/04/20 1350 99 F (37.2 C)     Temp Source 08/04/20 1350 Oral     SpO2 08/04/20 1350 97 %     Weight 08/04/20 1347 127 lb (57.6 kg)     Height 08/04/20 1347 5\' 7"  (1.702 m)     Head Circumference --      Peak Flow --      Pain Score 08/04/20 1347 5     Pain Loc --      Pain Edu? --      Excl. in GC? --     Constitutional: Alert and oriented. Well appearing and in no acute distress. Eyes: Conjunctivae are normal. Head: Atraumatic. Nose: No congestion/rhinnorhea. Mouth/Throat: Mucous membranes are moist.  Oropharynx non-erythematous. Neck: No stridor.   Hematological/Lymphatic/Immunilogical: No cervical lymphadenopathy. Cardiovascular: Normal rate, regular rhythm. Grossly normal heart sounds.  Good peripheral  circulation. Respiratory: Normal respiratory effort.  No retractions. Lungs CTAB. Gastrointestinal: Soft and nontender. No distention. No abdominal bruits. Genitourinary:  Musculoskeletal: No lower extremity tenderness nor edema.  No joint effusions. Neurologic:  Normal speech and language. No gross focal neurologic deficits are appreciated. No gait instability. Skin:  Skin is warm, dry and intact. No rash noted. Psychiatric: Mood and affect are normal. Speech and behavior are  normal.  ____________________________________________   LABS (all labs ordered are listed, but only abnormal results are displayed)  Labs Reviewed - No data to display ____________________________________________  EKG  Not indicated ____________________________________________  RADIOLOGY  ED MD interpretation:    CT of the abdomen shows prominent proximal small bowel with evidence of wall thickening consistent with infectious or inflammatory enteritis I, Yuvan Medinger, personally viewed and evaluated these images (plain radiographs) as part of my medical decision making, as well as reviewing the written report by the radiologist.  Official radiology report(s): DG Chest 2 View  Result Date: 08/04/2020 CLINICAL DATA:  Fever EXAM: CHEST - 2 VIEW COMPARISON:  05/28/2018 FINDINGS: The heart size and mediastinal contours are within normal limits. Both lungs are clear. The visualized skeletal structures are unremarkable. IMPRESSION: No acute abnormality of the lungs. Electronically Signed   By: Lauralyn Primes M.D.   On: 08/04/2020 14:51   CT Abdomen Pelvis W Contrast  Result Date: 08/04/2020 CLINICAL DATA:  Nausea vomiting. Elevated white blood cell count. Abdominal pain. EXAM: CT ABDOMEN AND PELVIS WITH CONTRAST TECHNIQUE: Multidetector CT imaging of the abdomen and pelvis was performed using the standard protocol following bolus administration of intravenous contrast. CONTRAST:  OMNIPAQUE IOHEXOL 300 MG/ML  SOLN COMPARISON:  CT, 11/01/2014. FINDINGS: Lower chest: Clear lung bases. Hepatobiliary: Liver normal in size and overall attenuation. Subcentimeter low-attenuation lesions in the right lobe, 1 persisting on the delayed sequence the other obscured likely a combination of a cyst and small hemangioma. No other liver abnormalities. Gallbladder minimally distended but otherwise unremarkable. No bile duct dilation. Pancreas: Unremarkable. No pancreatic ductal dilatation or surrounding  inflammatory changes. Spleen: Normal in size without focal abnormality. Adrenals/Urinary Tract: Adrenal glands are unremarkable. Kidneys are normal, without renal calculi, focal lesion, or hydronephrosis. Bladder is unremarkable. Stomach/Bowel: Unremarkable stomach. Prominent proximal small bowel with evidence wall thickening. Distal small bowel decompressed with no evidence of wall thickening. No definite mesenteric inflammation. Colon is mostly decompressed. No colonic wall thickening or inflammation. No evidence of appendicitis. Apparent surgical anastomosis material adjacent to the cecum suggest prior appendectomy. Vascular/Lymphatic: Mild atherosclerosis at the aortic bifurcation and along the right common iliac artery. No stenosis. No other vascular abnormality. No enlarged lymph nodes. Reproductive: Prostate is prominent and somewhat ill-defined measuring approximately 3.7 x 3.4 cm transversely. Other: No abdominal wall hernia or abnormality. No abdominopelvic ascites. Musculoskeletal: No acute or significant osseous findings. IMPRESSION: 1. Prominent proximal small bowel with evidence of wall thickening. This is consistent with an infectious or inflammatory enteritis in the proper clinical setting. 2. No other evidence of an acute abnormality within the abdomen or pelvis. Electronically Signed   By: Amie Portland M.D.   On: 08/04/2020 17:39    ____________________________________________   PROCEDURES  Procedure(s) performed (including Critical Care):  Procedures  ____________________________________________   INITIAL IMPRESSION / ASSESSMENT AND PLAN     49 year old male presenting to the emergency department for treatment and evaluation of abdominal pain with nausea and leukocytosis.  See HPI for further details. Plan will be to review labs drawn at [facility]. And review  results of the CT.   DIFFERENTIAL DIAGNOSIS  Diverticulitis, colitis, enteritis.  ED COURSE  Findings of bowel wall  thickening in addition to the leukocytosis and symptoms likely consistent with infectious enteritis. He will be treated with ciprofloxacin and Flagyl and given Bentyl. He was encouraged to call and schedule follow-up appointment with GI. He is to return to the emergency department for symptoms of change or worsen or for new concerns if unable to see primary care or GI.    ___________________________________________   FINAL CLINICAL IMPRESSION(S) / ED DIAGNOSES  Final diagnoses:  Enteritis     ED Discharge Orders         Ordered    ciprofloxacin (CIPRO) 500 MG tablet  2 times daily        08/04/20 1842    metroNIDAZOLE (FLAGYL) 500 MG tablet  3 times daily        08/04/20 1842    dicyclomine (BENTYL) 10 MG capsule  4 times daily PRN        08/04/20 1842           Johnathan Dennis was evaluated in Emergency Department on 08/05/2020 for the symptoms described in the history of present illness. He was evaluated in the context of the global COVID-19 pandemic, which necessitated consideration that the patient might be at risk for infection with the SARS-CoV-2 virus that causes COVID-19. Institutional protocols and algorithms that pertain to the evaluation of patients at risk for COVID-19 are in a state of rapid change based on information released by regulatory bodies including the CDC and federal and state organizations. These policies and algorithms were followed during the patient's care in the ED.   Note:  This document was prepared using Dragon voice recognition software and may include unintentional dictation errors.   Chinita Pester, FNP 08/05/20 1857    Merwyn Katos, MD 08/05/20 [phone]

## 2020-10-20 DIAGNOSIS — U071 COVID-19: Secondary | ICD-10-CM

## 2020-10-20 HISTORY — DX: COVID-19: U07.1

## 2020-11-02 ENCOUNTER — Other Ambulatory Visit: Payer: Managed Care, Other (non HMO)

## 2020-11-08 ENCOUNTER — Emergency Department: Payer: HRSA Program

## 2020-11-08 ENCOUNTER — Other Ambulatory Visit: Payer: Self-pay

## 2020-11-08 ENCOUNTER — Emergency Department
Admission: EM | Admit: 2020-11-08 | Discharge: 2020-11-08 | Disposition: A | Discharge status: Home / Self Care | Payer: HRSA Program | Attending: Emergency Medicine | Admitting: Emergency Medicine

## 2020-11-08 ENCOUNTER — Encounter: Payer: Self-pay | Admitting: Emergency Medicine

## 2020-11-08 DIAGNOSIS — R1110 Vomiting, unspecified: Secondary | ICD-10-CM | POA: Insufficient documentation

## 2020-11-08 DIAGNOSIS — R059 Cough, unspecified: Secondary | ICD-10-CM | POA: Diagnosis present

## 2020-11-08 DIAGNOSIS — Z20822 Contact with and (suspected) exposure to covid-19: Secondary | ICD-10-CM

## 2020-11-08 DIAGNOSIS — F17210 Nicotine dependence, cigarettes, uncomplicated: Secondary | ICD-10-CM | POA: Diagnosis not present

## 2020-11-08 DIAGNOSIS — U071 COVID-19: Secondary | ICD-10-CM | POA: Insufficient documentation

## 2020-11-08 DIAGNOSIS — Z1152 Encounter for screening for COVID-19: Secondary | ICD-10-CM

## 2020-11-08 LAB — SARS CORONAVIRUS 2 (TAT 6-24 HRS): SARS Coronavirus 2: POSITIVE — AB

## 2020-11-08 MED ORDER — IBUPROFEN 400 MG PO TABS
600.0000 mg | ORAL_TABLET | Freq: Once | ORAL | Status: AC
  Administered 2020-11-08: 600 mg via ORAL
  Filled 2020-11-08: qty 2

## 2020-11-08 NOTE — ED Provider Notes (Signed)
[facility] Emergency Department Provider Note  ____________________________________________  Time seen: Approximately 3:00 AM  I have reviewed the triage vital signs and the nursing notes.   HISTORY  Chief Complaint Generalized Body Aches, Chills, Cough, and Fever   HPI Johnathan Dennis is a 50 y.o. male who presents for evaluation of COVID-like symptoms.  Symptoms started today.  Patient is complaining of body aches, chills, mild cough, fever, cold sweats, vomiting, and a headache.  No chest pain, no shortness of breath, no abdominal pain no diarrhea.   Is here with his partner who also has COVID-like symptoms.  Positive exposure.  History reviewed. No pertinent past medical history.  There are no problems to display for this patient.   Past Surgical History:  Procedure Laterality Date  . APPENDECTOMY      Prior to Admission medications   Medication Sig Start Date End Date Taking? Authorizing Provider  dicyclomine (BENTYL) 10 MG capsule Take 1 capsule (10 mg total) by mouth 4 (four) times daily as needed for up to 14 days for spasms. 08/04/20 08/18/20  Triplett, Rulon Eisenmenger B, FNP  ibuprofen (ADVIL,MOTRIN) 800 MG tablet Take 1 tablet (800 mg total) by mouth every 8 (eight) hours as needed. 06/20/17   Little, Traci M, PA-C    Allergies Patient has no known allergies.  History reviewed. No pertinent family history.  Social History Social History   Tobacco Use  . Smoking status: Current Every Day Smoker    Packs/day: 1.00    Types: Cigarettes  . Smokeless tobacco: Never Used  Substance Use Topics  . Alcohol use: No  . Drug use: Yes    Types: Marijuana    Review of Systems  Constitutional: + fever, chills, body aches Eyes: Negative for visual changes. ENT: Negative for sore throat. + congestion Neck: No neck pain  Cardiovascular: Negative for chest pain. Respiratory: Negative for shortness of breath. + cough Gastrointestinal: Negative for  abdominal pain, or diarrhea. + vomiting Genitourinary: Negative for dysuria. Musculoskeletal: Negative for back pain. Skin: Negative for rash. Neurological: Negative for headaches, weakness or numbness. Psych: No SI or HI  ____________________________________________   PHYSICAL EXAM:  VITAL SIGNS: ED Triage Vitals [11/08/20 0123]  Enc Vitals Group     BP 129/79     Pulse Rate 74     Resp 16     Temp 100 F (37.8 C)     Temp Source Oral     SpO2 97 %     Weight 130 lb (59 kg)     Height 5\' 7"  (1.702 m)     Head Circumference      Peak Flow      Pain Score 6     Pain Loc      Pain Edu?      Excl. in GC?     Constitutional: Alert and oriented. Well appearing and in no apparent distress. HEENT:      Head: Normocephalic and atraumatic.         Eyes: Conjunctivae are normal. Sclera is non-icteric.       Mouth/Throat: Mucous membranes are moist.       Neck: Supple with no signs of meningismus. Cardiovascular: Regular rate and rhythm. No murmurs, gallops, or rubs. 2+ symmetrical distal pulses are present in all extremities. No JVD. Respiratory: Normal respiratory effort. Lungs are clear to auscultation bilaterally. No wheezes, crackles, or rhonchi.  Gastrointestinal: Soft, non tender, and non distended. Musculoskeletal: No edema, cyanosis, or erythema  of extremities. Neurologic: Normal speech and language. Face is symmetric. Moving all extremities. No gross focal neurologic deficits are appreciated. Skin: Skin is warm, dry and intact. No rash noted. Psychiatric: Mood and affect are normal. Speech and behavior are normal.  ____________________________________________   LABS (all labs ordered are listed, but only abnormal results are displayed)  Labs Reviewed  SARS CORONAVIRUS 2 (TAT 6-24 HRS)   ____________________________________________  EKG  none  ____________________________________________  RADIOLOGY  I have personally reviewed the images performed during  this visit and I agree with the Radiologist's read.   Interpretation by Radiologist:  DG Chest 2 View  Result Date: 11/08/2020 CLINICAL DATA:  Cough, fevers, chills, cold sweats and body aches, denies known COVID exposure EXAM: CHEST - 2 VIEW COMPARISON:  Radiograph 08/04/2020 FINDINGS: No consolidation, features of edema, pneumothorax, or effusion. Pulmonary vascularity is normally distributed. The cardiomediastinal contours are unremarkable. No acute osseous or soft tissue abnormality. Mild pectus deformity of the chest. IMPRESSION: No acute cardiopulmonary abnormalities. Electronically Signed   By: Kreg Shropshire M.D.   On: 11/08/2020 02:07     ____________________________________________   PROCEDURES  Procedure(s) performed: None Procedures Critical Care performed:  None ____________________________________________   INITIAL IMPRESSION / ASSESSMENT AND PLAN / ED COURSE  50 y.o. male who presents for evaluation of COVID-like symptoms that started today.  Patient is here requesting COVID test.  Positive exposure.  Here with partner who also has COVID-like symptoms.  No chest pain, no shortness of breath, normal vital signs, normal work of breathing, normal sats both at rest and with ambulation.  Exam otherwise nonfocal.  Chest x-ray visualized by me with no signs of multifocal pneumonia.  COVID test is pending.  Discussed quarantine, oxygen monitoring at home, symptom relief, and post-COVID follow-up if test comes back positive.  Also discussed my standard return precautions.       _____________________________________________ Please note:  Patient was evaluated in Emergency Department today for the symptoms described in the history of present illness. Patient was evaluated in the context of the global COVID-19 pandemic, which necessitated consideration that the patient might be at risk for infection with the SARS-CoV-2 virus that causes COVID-19. Institutional protocols and algorithms  that pertain to the evaluation of patients at risk for COVID-19 are in a state of rapid change based on information released by regulatory bodies including the CDC and federal and state organizations. These policies and algorithms were followed during the patient's care in the ED.  Some ED evaluations and interventions may be delayed as a result of limited staffing during the pandemic.   [place] Controlled Substance Database was reviewed by me. ____________________________________________   FINAL CLINICAL IMPRESSION(S) / ED DIAGNOSES   Final diagnoses:  Encounter for screening for COVID-19  Suspected COVID-19 virus infection      NEW MEDICATIONS STARTED DURING THIS VISIT:  ED Discharge Orders    None       Note:  This document was prepared using Dragon voice recognition software and may include unintentional dictation errors.    Don Perking, [place], MD 11/08/20 [phone]

## 2020-11-08 NOTE — Discharge Instructions (Signed)
QUARANTINE INSTRUCTION  Follow these instructions at home:  Protecting others To avoid spreading the illness to other people: Quarantine in your home until you have had no cough and fever for 7 days. Household members should also be quarantine for at least 14 days after being exposed to you. Wash your hands often with soap and water. If soap and water are not available, use an alcohol-based hand sanitizer. If you have not cleaned your hands, do not touch your face. Make sure that all people in your household wash their hands well and often. Cover your nose and mouth when you cough or sneeze. Throw away used tissues. Stay home if you have any cold-like or flu-like symptoms. General instructions Go to your local pharmacy and buy a pulse oximeter (this is a machine that measures your oxygen). Check your oxygen levels at least 3 times a day. If your oxygen level is 90% or less return to the emergency room immediately Take over-the-counter and prescription medicines only as told by your health care provider. If you need medication for fever take tylenol or ibuprofen Drink enough fluid to keep your urine pale yellow. Rest at home as directed by your health care provider. Do not give aspirin to a child with the flu, because of the association with Reye's syndrome. Do not use tobacco products, including cigarettes, chewing tobacco, and e-cigarettes. If you need help quitting, ask your health care provider. Keep all follow-up visits as told by your health care provider. This is important. How is this prevented? Avoid areas where an outbreak has been reported. Avoid large groups of people. Keep a safe distance from people who are coughing and sneezing. Do not touch your face if you have not cleaned your hands. When you are around people who are sick or might be sick, wear a mask to protect yourself. Contact a health care provider if: You have symptoms of SARS (cough, fever, chest pain, shortness of  breath) that are not getting better at home. You have a fever. If you have difficulty breathing go to your local ER or call 911    Post- COVID Clinic  [phone]

## 2020-11-08 NOTE — ED Triage Notes (Signed)
Pt to ED from home c/o cough, fever, chills, cold sweats, body aches, headache that started today.  Wanting a COVID test.  Denies known exposure around COVID.  Used tylenol earlier today.  Pt A&Ox4, chest rise even and unlabored, in NAD at this time.

## 2021-03-21 ENCOUNTER — Encounter: Payer: Self-pay | Admitting: Emergency Medicine

## 2021-03-21 ENCOUNTER — Ambulatory Visit
Admission: EM | Admit: 2021-03-21 | Discharge: 2021-03-21 | Disposition: A | Discharge status: Home / Self Care | Payer: BC Managed Care – PPO | Attending: Physician Assistant | Admitting: Physician Assistant

## 2021-03-21 ENCOUNTER — Other Ambulatory Visit: Payer: Self-pay

## 2021-03-21 DIAGNOSIS — R0981 Nasal congestion: Secondary | ICD-10-CM

## 2021-03-21 DIAGNOSIS — J101 Influenza due to other identified influenza virus with other respiratory manifestations: Secondary | ICD-10-CM | POA: Diagnosis not present

## 2021-03-21 DIAGNOSIS — F17210 Nicotine dependence, cigarettes, uncomplicated: Secondary | ICD-10-CM | POA: Diagnosis not present

## 2021-03-21 DIAGNOSIS — Z20822 Contact with and (suspected) exposure to covid-19: Secondary | ICD-10-CM | POA: Insufficient documentation

## 2021-03-21 DIAGNOSIS — R059 Cough, unspecified: Secondary | ICD-10-CM

## 2021-03-21 DIAGNOSIS — Z8616 Personal history of COVID-19: Secondary | ICD-10-CM | POA: Diagnosis not present

## 2021-03-21 LAB — RESP PANEL BY RT-PCR (FLU A&B, COVID) ARPGX2
Influenza A by PCR: POSITIVE — AB
Influenza B by PCR: NEGATIVE
SARS Coronavirus 2 by RT PCR: NEGATIVE

## 2021-03-21 MED ORDER — PSEUDOEPH-BROMPHEN-DM 30-2-10 MG/5ML PO SYRP: 10.0000 mL | ORAL_SOLUTION | Freq: Four times a day (QID) | ORAL | 0 refills | Status: AC | PRN

## 2021-03-21 MED ORDER — OSELTAMIVIR PHOSPHATE 75 MG PO CAPS: 75.0000 mg | ORAL_CAPSULE | Freq: Two times a day (BID) | ORAL | 0 refills | Status: AC

## 2021-03-21 NOTE — ED Triage Notes (Signed)
Patient c/o headache, cough, nasal congestion, and chills that started yesterday. Patient's step daughter tested positive for the flu on Friday.

## 2021-03-21 NOTE — ED Provider Notes (Signed)
[facility]    CSN: [id number] Arrival date & time: 03/21/21  1334      History   Chief Complaint Chief Complaint  Patient presents with  . Cough  . Nasal Congestion  . Headache    HPI Johnathan Dennis is a 50 y.o. male presenting for onset of low-grade fever up to 99.7 degrees, fatigue, aches, cough, headache, mild sore throat, nasal congestion, chills and sweats yesterday.  Patient says that his stepdaughter tested positive for influenza a few days ago.  He has not received his flu vaccine.  No known COVID exposure.  Fully vaccinated for COVID-19.  Also has a personal history of COVID-19 less than 5 months ago.  Patient has been taking Tylenol for symptoms.  He is denying any chest pain or tightness or breathing difficulty.  No nausea/vomiting or diarrhea.  No other complaints or concerns.  HPI  Past Medical History:  Diagnosis Date  . COVID-19 10/2020    There are no problems to display for this patient.   Past Surgical History:  Procedure Laterality Date  . APPENDECTOMY         Home Medications    Prior to Admission medications   Medication Sig Start Date End Date Taking? Authorizing Provider  dicyclomine (BENTYL) 10 MG capsule Take 1 capsule (10 mg total) by mouth 4 (four) times daily as needed for up to 14 days for spasms. 08/04/20 08/18/20  Triplett, Rulon Eisenmenger B, FNP  ibuprofen (ADVIL,MOTRIN) 800 MG tablet Take 1 tablet (800 mg total) by mouth every 8 (eight) hours as needed. 06/20/17   Little, Jordan Likes, PA-C    Family History History reviewed. No pertinent family history.  Social History Social History   Tobacco Use  . Smoking status: Current Every Day Smoker    Packs/day: 1.00    Types: Cigarettes  . Smokeless tobacco: Never Used  Substance Use Topics  . Alcohol use: No  . Drug use: Yes    Types: Marijuana     Allergies   Patient has no known allergies.   Review of Systems Review of Systems  Constitutional: Positive for chills.  Negative for fatigue and fever.  HENT: Positive for congestion and rhinorrhea. Negative for sinus pressure, sinus pain and sore throat.   Respiratory: Positive for cough. Negative for shortness of breath.   Gastrointestinal: Negative for abdominal pain, diarrhea, nausea and vomiting.  Musculoskeletal: Negative for myalgias.  Neurological: Positive for headaches. Negative for weakness and light-headedness.  Hematological: Negative for adenopathy.     Physical Exam Triage Vital Signs ED Triage Vitals  Enc Vitals Group     BP 03/21/21 1418 112/85     Pulse Rate 03/21/21 1418 74     Resp 03/21/21 1418 18     Temp 03/21/21 1418 99.7 F (37.6 C)     Temp Source 03/21/21 1418 Oral     SpO2 03/21/21 1418 100 %     Weight 03/21/21 1417 130 lb (59 kg)     Height 03/21/21 1417 5\' 7"  (1.702 m)     Head Circumference --      Peak Flow --      Pain Score 03/21/21 1416 0     Pain Loc --      Pain Edu? --      Excl. in GC? --    No data found.  Updated Vital Signs BP 112/85 (BP Location: Right Arm)   Pulse 74   Temp 99.7 F (37.6 C) (Oral)  Resp 18   Ht 5\' 7"  (1.702 m)   Wt 130 lb (59 kg)   SpO2 100%   BMI 20.36 kg/m       Physical Exam Vitals and nursing note reviewed.  Constitutional:      General: He is not in acute distress.    Appearance: Normal appearance. He is well-developed. He is not ill-appearing or diaphoretic.  HENT:     Head: Normocephalic and atraumatic.     Nose: Congestion and rhinorrhea present.     Mouth/Throat:     Mouth: Mucous membranes are moist.     Pharynx: Oropharynx is clear. Uvula midline. Posterior oropharyngeal erythema present.     Tonsils: No tonsillar abscesses.  Eyes:     General: No scleral icterus.       Right eye: No discharge.        Left eye: No discharge.     Conjunctiva/sclera: Conjunctivae normal.  Neck:     Thyroid: No thyromegaly.     Trachea: No tracheal deviation.  Cardiovascular:     Rate and Rhythm: Normal rate and  regular rhythm.     Heart sounds: Normal heart sounds.  Pulmonary:     Effort: Pulmonary effort is normal. No respiratory distress.     Breath sounds: Normal breath sounds. No wheezing, rhonchi or rales.  Musculoskeletal:     Cervical back: Normal range of motion and neck supple.  Lymphadenopathy:     Cervical: No cervical adenopathy.  Skin:    General: Skin is warm and dry.     Findings: No rash.  Neurological:     General: No focal deficit present.     Mental Status: He is alert. Mental status is at baseline.     Motor: No weakness.     Gait: Gait normal.  Psychiatric:        Mood and Affect: Mood normal.        Behavior: Behavior normal.        Thought Content: Thought content normal.      UC Treatments / Results  Labs (all labs ordered are listed, but only abnormal results are displayed) Labs Reviewed  RESP PANEL BY RT-PCR (FLU A&B, COVID) ARPGX2 - Abnormal; Notable for the following components:      Result Value   Influenza A by PCR POSITIVE (*)    All other components within normal limits    EKG   Radiology No results found.  Procedures Procedures (including critical care time)  Medications Ordered in UC Medications - No data to display  Initial Impression / Assessment and Plan / UC Course  I have reviewed the triage vital signs and the nursing notes.  Pertinent labs & imaging results that were available during my care of the patient were reviewed by me and considered in my medical decision making (see chart for details).   50 year old male presenting for low-grade fever and flulike symptoms starting yesterday.  Positive exposure to influenza.  Vital signs are normal and stable clinic today.  He is overall well-appearing.  Chest clear to auscultation heart regular rate and rhythm.  Positive for influenza A.  He is starting on Tamiflu at this time.  I have also sent Bromfed-DM to pharmacy and given a work note.  Reviewed when to return to work.  Supportive  care advised increased rest and fluids.  Advised him to follow-up as needed for any worsening symptoms or if not better in 7 to 10 days.   Final Clinical  Impressions(s) / UC Diagnoses   Final diagnoses:  Influenza A  Cough  Nasal congestion     Discharge Instructions     The flu test is positive.  I have sent in an antiviral medication.  Need to be out of work as long as you have a fever, may be another few days.    ED Prescriptions    None     PDMP not reviewed this encounter.   Shirlee Latch, PA-C 03/21/21 [phone]

## 2021-03-21 NOTE — Discharge Instructions (Addendum)
The flu test is positive.  I have sent in an antiviral medication.  Need to be out of work as long as you have a fever, may be another few days.  I have sent in some cough medication for you.  Rest increase fluids.  Tylenol and or Motrin for fever.  Follow-up for any worsening symptoms or if you are not better within 7 to 10 days.

## 2021-04-16 ENCOUNTER — Encounter: Payer: Self-pay | Admitting: Family Medicine

## 2021-04-16 ENCOUNTER — Ambulatory Visit: Payer: BC Managed Care – PPO | Admitting: Family Medicine

## 2021-04-16 ENCOUNTER — Other Ambulatory Visit: Payer: Self-pay

## 2021-04-16 VITALS — BP 98/72 | HR 76 | Temp 98.1°F | Ht 67.0 in | Wt 129.0 lb

## 2021-04-16 DIAGNOSIS — Z720 Tobacco use: Secondary | ICD-10-CM | POA: Insufficient documentation

## 2021-04-16 DIAGNOSIS — F17200 Nicotine dependence, unspecified, uncomplicated: Secondary | ICD-10-CM

## 2021-04-16 DIAGNOSIS — Z716 Tobacco abuse counseling: Secondary | ICD-10-CM

## 2021-04-16 NOTE — Assessment & Plan Note (Signed)
Patient with 11.25 pack year history, denies any current systemic complaints and is at a pre-contemplative stage in regards to cessation. Physical findings from a cardiopulmonary and gastrointestinal standpoint are reassuring. I will order baseline labs for this patient and have him return in 3 months for an annual physical. We did have lengthy smoking cessation counseling for 11 minutes today and he is understanding that we will discuss this at future visits.

## 2021-04-16 NOTE — Assessment & Plan Note (Signed)
The patient was counseled on the dangers of tobacco use, and was advised to quit.  Reviewed strategies to maximize success, including stress management, support of family/friends and pharmacotherapy (patches, prescription medications).  He is not ready to quit at this time, support provided, follow-up coordinated for 3 months and he is aware we will discuss this again at that time. I have advised him to reach out to me over the interim for any questions.  Total duration of counseling 11 minutes

## 2021-04-16 NOTE — Progress Notes (Signed)
     Primary Care / Sports Medicine Office Visit  Patient Information:  Patient ID: [name], male DOB: 05/21/1971 Age: 50 y.o. MRN: [id number]   Johnathan Dennis is a pleasant 50 y.o. male presenting with the following:  Chief Complaint  Patient presents with   New Patient (Initial Visit)   Establish Care   Nicotine Dependence    Patient smokes 10-15 cigarettes daily and needs an assessment completed for work; has been a smoker since 50 years old    Review of Systems pertinent details above   Patient Active Problem List   Diagnosis Date Noted   Encounter for smoking cessation counseling 04/16/2021   Smoking addiction 04/16/2021   Past Medical History:  Diagnosis Date   COVID-19 10/2020   Outpatient Encounter Medications as of 04/16/2021  Medication Sig   [DISCONTINUED] dicyclomine (BENTYL) 10 MG capsule Take 1 capsule (10 mg total) by mouth 4 (four) times daily as needed for up to 14 days for spasms.   [DISCONTINUED] ibuprofen (ADVIL,MOTRIN) 800 MG tablet Take 1 tablet (800 mg total) by mouth every 8 (eight) hours as needed.   No facility-administered encounter medications on file as of 04/16/2021.   Past Surgical History:  Procedure Laterality Date   APPENDECTOMY      Vitals:   04/16/21 0839  BP: 98/72  Pulse: 76  Temp: 98.1 F (36.7 C)  SpO2: 98%   Vitals:   04/16/21 0839  Weight: 129 lb (58.5 kg)  Height: 5\' 7"  (1.702 m)   Body mass index is 20.2 kg/m.  No results found.   Independent interpretation of notes and tests performed by another provider:   None  Procedures performed:   None  Pertinent History, Exam, Impression, and Recommendations:   Encounter for smoking cessation counseling The patient was counseled on the dangers of tobacco use, and was advised to quit.  Reviewed strategies to maximize success, including stress management, support of family/friends and pharmacotherapy (patches, prescription medications).  He is not ready  to quit at this time, support provided, follow-up coordinated for 3 months and he is aware we will discuss this again at that time. I have advised him to reach out to me over the interim for any questions.  Total duration of counseling 11 minutes  Smoking addiction Patient with 11.25 pack year history, denies any current systemic complaints and is at a pre-contemplative stage in regards to cessation. Physical findings from a cardiopulmonary and gastrointestinal standpoint are reassuring. I will order baseline labs for this patient and have him return in 3 months for an annual physical. We did have lengthy smoking cessation counseling for 11 minutes today and he is understanding that we will discuss this at future visits.    Orders & Medications No orders of the defined types were placed in this encounter.  Orders Placed This Encounter  Procedures   CBC   TSH   VITAMIN D 25 Hydroxy (Vit-D Deficiency, Fractures)   Basic metabolic panel     Return in 3 months (on 07/17/2021) for Annual physical.     07/19/2021, MD   Primary Care Sports Medicine [facility]

## 2021-04-16 NOTE — Patient Instructions (Addendum)
-   Obtain blood work with orders provided - Return in 3 months for physical - Contact for any questions

## 2021-04-17 ENCOUNTER — Other Ambulatory Visit: Payer: Self-pay | Admitting: Family Medicine

## 2021-04-17 DIAGNOSIS — R7989 Other specified abnormal findings of blood chemistry: Secondary | ICD-10-CM

## 2021-04-17 LAB — CBC
Hematocrit: 45.6 % (ref 37.5–51.0)
Hemoglobin: 15.1 g/dL (ref 13.0–17.7)
MCH: 28.8 pg (ref 26.6–33.0)
MCHC: 33.1 g/dL (ref 31.5–35.7)
MCV: 87 fL (ref 79–97)
Platelets: 181 10*3/uL (ref 150–450)
RBC: 5.25 x10E6/uL (ref 4.14–5.80)
RDW: 13.5 % (ref 11.6–15.4)
WBC: 5.8 10*3/uL (ref 3.4–10.8)

## 2021-04-17 LAB — BASIC METABOLIC PANEL
BUN/Creatinine Ratio: 8 — ABNORMAL LOW (ref 9–20)
BUN: 9 mg/dL (ref 6–24)
CO2: 25 mmol/L (ref 20–29)
Calcium: 9.5 mg/dL (ref 8.7–10.2)
Chloride: 104 mmol/L (ref 96–106)
Creatinine, Ser: 1.13 mg/dL (ref 0.76–1.27)
Glucose: 62 mg/dL — ABNORMAL LOW (ref 65–99)
Potassium: 4.7 mmol/L (ref 3.5–5.2)
Sodium: 143 mmol/L (ref 134–144)
eGFR: 81 mL/min/{1.73_m2} (ref >59)

## 2021-04-17 LAB — VITAMIN D 25 HYDROXY (VIT D DEFICIENCY, FRACTURES): Vit D, 25-Hydroxy: 14.2 ng/mL — ABNORMAL LOW (ref 30.0–100.0)

## 2021-04-17 LAB — TSH: TSH: 2.61 u[IU]/mL (ref 0.450–4.500)

## 2021-04-17 MED ORDER — VITAMIN D (ERGOCALCIFEROL) 1.25 MG (50000 UNIT) PO CAPS
50000.0000 [IU] | ORAL_CAPSULE | ORAL | 0 refills | Status: DC
Start: 2021-04-17 — End: 2022-12-25

## 2021-07-17 ENCOUNTER — Encounter: Payer: BC Managed Care – PPO | Admitting: Family Medicine

## 2021-08-07 ENCOUNTER — Encounter: Payer: BC Managed Care – PPO | Admitting: Family Medicine

## 2021-08-08 ENCOUNTER — Emergency Department
Admission: EM | Admit: 2021-08-08 | Discharge: 2021-08-08 | Disposition: A | Discharge status: Home / Self Care | Payer: BC Managed Care – PPO | Attending: Emergency Medicine | Admitting: Emergency Medicine

## 2021-08-08 ENCOUNTER — Other Ambulatory Visit: Payer: Self-pay

## 2021-08-08 ENCOUNTER — Emergency Department: Payer: BC Managed Care – PPO

## 2021-08-08 DIAGNOSIS — R1032 Left lower quadrant pain: Secondary | ICD-10-CM | POA: Diagnosis not present

## 2021-08-08 DIAGNOSIS — Z5321 Procedure and treatment not carried out due to patient leaving prior to being seen by health care provider: Secondary | ICD-10-CM | POA: Diagnosis not present

## 2021-08-08 DIAGNOSIS — Z8616 Personal history of COVID-19: Secondary | ICD-10-CM | POA: Diagnosis not present

## 2021-08-08 DIAGNOSIS — R6883 Chills (without fever): Secondary | ICD-10-CM | POA: Insufficient documentation

## 2021-08-08 LAB — CBC WITH DIFFERENTIAL/PLATELET
Abs Immature Granulocytes: 0.05 10*3/uL (ref 0.00–0.07)
Basophils Absolute: 0.1 10*3/uL (ref 0.0–0.1)
Basophils Relative: 0 %
Eosinophils Absolute: 0.1 10*3/uL (ref 0.0–0.5)
Eosinophils Relative: 0 %
HCT: 42.4 % (ref 39.0–52.0)
Hemoglobin: 14.6 g/dL (ref 13.0–17.0)
Immature Granulocytes: 0 %
Lymphocytes Relative: 13 %
Lymphs Abs: 1.7 10*3/uL (ref 0.7–4.0)
MCH: 29.4 pg (ref 26.0–34.0)
MCHC: 34.4 g/dL (ref 30.0–36.0)
MCV: 85.5 fL (ref 80.0–100.0)
Monocytes Absolute: 0.8 10*3/uL (ref 0.1–1.0)
Monocytes Relative: 6 %
Neutro Abs: 11 10*3/uL — ABNORMAL HIGH (ref 1.7–7.7)
Neutrophils Relative %: 81 %
Platelets: 195 10*3/uL (ref 150–400)
RBC: 4.96 MIL/uL (ref 4.22–5.81)
RDW: 13.8 % (ref 11.5–15.5)
WBC: 13.7 10*3/uL — ABNORMAL HIGH (ref 4.0–10.5)
nRBC: 0 % (ref 0.0–0.2)

## 2021-08-08 LAB — COMPREHENSIVE METABOLIC PANEL
ALT: 12 U/L (ref 0–44)
AST: 18 U/L (ref 15–41)
Albumin: 4 g/dL (ref 3.5–5.0)
Alkaline Phosphatase: 58 U/L (ref 38–126)
Anion gap: 7 (ref 5–15)
BUN: 12 mg/dL (ref 6–20)
CO2: 28 mmol/L (ref 22–32)
Calcium: 9.2 mg/dL (ref 8.9–10.3)
Chloride: 105 mmol/L (ref 98–111)
Creatinine, Ser: 1.4 mg/dL — ABNORMAL HIGH (ref 0.61–1.24)
GFR, Estimated: >60 mL/min (ref >60)
Glucose, Bld: 167 mg/dL — ABNORMAL HIGH (ref 70–99)
Potassium: 4.4 mmol/L (ref 3.5–5.1)
Sodium: 140 mmol/L (ref 135–145)
Total Bilirubin: 0.7 mg/dL (ref 0.3–1.2)
Total Protein: 6.9 g/dL (ref 6.5–8.1)

## 2021-08-08 LAB — LIPASE, BLOOD: Lipase: 25 U/L (ref 11–51)

## 2021-08-08 MED ORDER — IOHEXOL 300 MG/ML  SOLN
100.0000 mL | Freq: Once | INTRAMUSCULAR | Status: AC | PRN
  Administered 2021-08-08: 100 mL via INTRAVENOUS

## 2021-08-08 MED ORDER — ONDANSETRON HCL 4 MG/2ML IJ SOLN
4.0000 mg | Freq: Once | INTRAMUSCULAR | Status: AC
  Administered 2021-08-08: 4 mg via INTRAVENOUS
  Filled 2021-08-08: qty 2

## 2021-08-08 MED ORDER — FENTANYL CITRATE PF 50 MCG/ML IJ SOSY
50.0000 ug | PREFILLED_SYRINGE | Freq: Once | INTRAMUSCULAR | Status: AC
Start: 2021-08-08 — End: 2021-08-08
  Administered 2021-08-08: 50 ug via INTRAVENOUS
  Filled 2021-08-08: qty 1

## 2021-08-08 NOTE — ED Provider Notes (Signed)
HPI: Pt is a 50 y.o. male who presents with complaints of abdominal pain   The patient p/w  LLQ abd. Pain   ROS: Denies fever, chest pain, vomiting  Past Medical History:  Diagnosis Date   COVID-19 10/2020   There were no vitals filed for this visit.  Focused Physical Exam: Gen: appears in pain Head: atraumatic, normocephalic Eyes: Extraocular movements grossly intact; conjunctiva clear CV: RRR Lung: No increased WOB, no stridor GI: ND, no obvious masses, tender LLQ  Neuro: Alert and awake  Medical Decision Making and Plan: Given the patient's initial medical screening exam, the following diagnostic evaluation has been ordered. The patient will be placed in the appropriate treatment space, once one is available, to complete the evaluation and treatment. I have discussed the plan of care with the patient and I have advised the patient that an ED physician or mid-level practitioner will reevaluate their condition after the test results have been received, as the results may give them additional insight into the type of treatment they may need.   Diagnostics: CT  Treatments: fent    Concha Se, MD 08/08/21 1011

## 2021-08-08 NOTE — ED Triage Notes (Signed)
Pt here with abd pain and chills that started this morning. Pt is unable to states exactly where the pain is. Pt is in wheelchair bent over in pain. Pt does not have his appendix. Pt now states pain is LLQ. Pt in NAD in triage.

## 2021-08-16 ENCOUNTER — Telehealth: Payer: BC Managed Care – PPO | Admitting: Physician Assistant

## 2021-08-16 DIAGNOSIS — F17200 Nicotine dependence, unspecified, uncomplicated: Secondary | ICD-10-CM | POA: Diagnosis not present

## 2021-08-16 DIAGNOSIS — Z716 Tobacco abuse counseling: Secondary | ICD-10-CM | POA: Diagnosis not present

## 2021-08-16 MED ORDER — VARENICLINE TARTRATE 0.5 MG PO TABS: ORAL_TABLET | ORAL | 0 refills | Status: DC

## 2021-08-16 NOTE — Progress Notes (Signed)
E-Visit for Smoking Cessation  Congratulations for your interest in quitting smoking!  Quitting smoking is one of the most important things you can do to protect your health.  We are here to help you! Did you know that if you quit smoking 1 pack per day you could save up to $2550.00 per year?  Medications are not appropriate for everyone depending upon your situation and any health issues you may have. If we do prescribe medication, it will be for 1 month at time and you will be required to have a follow up E-Visit at 1 month to assess how you are doing and any side effects.  This could be up to a total of 3 months.    Support of your friends, family and work colleagues is very important. Please let them know that you are trying to stop smoking so that they understand your need for support in this goal.  Remove tobacco products from your environment  Set a quit date ideally within 2 weeks.  You should totally abstain from smoking after your quit date. A single puff could hurt your progress or cause you to relapse  If there are others in your household that smoke, ask them to try to quit or abstain from smoking in your presence  You may notice nicotine withdrawal symptoms such as increased appetite and weight gain, changes in mood, insomnia, irritability and/or anxiety. These symptoms peak in the first three days after smoking cessation and subside over the next 3-4 weeks.   I have prescribed Chantix (Varenicline) .5 mg tablets. Start 1 week before your quit date. Take after eating with a full glass of water. take one tablet by mouth daily for 3 days. On day 4, begin taking one tablet twice a day for 4 days. Then start taking 2 tablets twice daily for the rest of the 12 week course.  You may use over the counter Nicotine gum. Chew one piece of gum every one to two hours while awake and when there is an urge to smoke. Use up to 24 pieces of gum per day for six weeks. Then gradually reduce use over a  second six weeks. Then gradually reduce use over a second six weeks. Chew the gum slowly - using the "chew" and "park" method. Chew the gum until the nicotine taste appears, then "park" the gum until the taste disappears, then chew again to release more nicotine. Chew the gum for 30 minutes.  A combination of behavioral and medication can improve the success of you quitting smoking. You may want to use the 1-800-QUIT-NOW free support line.  Also, the Department of Health and Human Services provides Smoke free apps for smartphones: https://smokefree.gov/tools-tips/apps  If you happen to break your plan and have a cigarette, keep taking your medications and continue to try to abstain.  Do not give up!  MAKE SURE YOU  Take any prescribed medications only as instructed.  If you miss a dose of medication, take the next dose when it is due and get back on schedule. DO NOT double up on medications.  Mark your calendar to do your Follow Up Smoking Cessation E-Visit in one month    Thank you for choosing an e-visit.  Your e-visit answers were reviewed by a board certified advanced clinical practitioner to complete your personal care plan. Depending upon the condition, your plan could have included both over the counter or prescription medications.  Please review your pharmacy choice. Make sure the pharmacy is open   so you can pick up prescription now. If there is a problem, you may contact your provider through MyChart messaging and have the prescription routed to another pharmacy.  Your safety is important to us. If you have drug allergies check your prescription carefully.   For the next 24 hours you can use MyChart to ask questions about today's visit, request a non-urgent call back, or ask for a work or school excuse. You will get an email in the next two days asking about your experience. I hope that your e-visit has been valuable and will speed your recovery.  I provided 5 minutes of non  face-to-face time during this encounter for chart review and documentation.   

## 2022-10-24 ENCOUNTER — Other Ambulatory Visit: Payer: Self-pay | Admitting: Family Medicine

## 2022-10-24 ENCOUNTER — Encounter: Payer: Self-pay | Admitting: Family Medicine

## 2022-10-24 DIAGNOSIS — R7989 Other specified abnormal findings of blood chemistry: Secondary | ICD-10-CM

## 2022-10-30 ENCOUNTER — Encounter: Payer: BC Managed Care – PPO | Admitting: Family Medicine

## 2022-12-25 ENCOUNTER — Ambulatory Visit (INDEPENDENT_AMBULATORY_CARE_PROVIDER_SITE_OTHER): Payer: BC Managed Care – PPO | Admitting: Family Medicine

## 2022-12-25 ENCOUNTER — Encounter: Payer: Self-pay | Admitting: Family Medicine

## 2022-12-25 VITALS — BP 130/80 | HR 82 | Ht 67.0 in | Wt 134.0 lb

## 2022-12-25 DIAGNOSIS — Z1159 Encounter for screening for other viral diseases: Secondary | ICD-10-CM

## 2022-12-25 DIAGNOSIS — Z114 Encounter for screening for human immunodeficiency virus [HIV]: Secondary | ICD-10-CM | POA: Diagnosis not present

## 2022-12-25 DIAGNOSIS — Z1211 Encounter for screening for malignant neoplasm of colon: Secondary | ICD-10-CM | POA: Insufficient documentation

## 2022-12-25 DIAGNOSIS — Z0000 Encounter for general adult medical examination without abnormal findings: Secondary | ICD-10-CM | POA: Diagnosis not present

## 2022-12-25 DIAGNOSIS — Z720 Tobacco use: Secondary | ICD-10-CM

## 2022-12-25 DIAGNOSIS — Z125 Encounter for screening for malignant neoplasm of prostate: Secondary | ICD-10-CM

## 2022-12-25 DIAGNOSIS — Z13220 Encounter for screening for lipoid disorders: Secondary | ICD-10-CM

## 2022-12-25 DIAGNOSIS — E559 Vitamin D deficiency, unspecified: Secondary | ICD-10-CM

## 2022-12-25 MED ORDER — BUPROPION HCL ER (SR) 150 MG PO TB12: ORAL_TABLET | ORAL | 0 refills | Status: AC

## 2022-12-28 ENCOUNTER — Encounter: Payer: Self-pay | Admitting: Family Medicine

## 2022-12-28 NOTE — Assessment & Plan Note (Signed)
Smoking cessation counseling x 5 minutes, agreed to start Wellbutrin x 3 months.Reevaluation at that time.

## 2022-12-28 NOTE — Assessment & Plan Note (Signed)
Colonoscopy referral placed

## 2022-12-28 NOTE — Progress Notes (Signed)
Annual Physical Exam Visit  Patient Information:  Patient ID: Johnathan Dennis, male DOB: 18-Jun-1971 Age: 52 y.o. MRN: [id number]   Subjective:   CC: Annual Physical Exam  HPI:  Johnathan Dennis is here for their annual physical.  I reviewed the past medical history, family history, social history, surgical history, and allergies today and changes were made as necessary.  Please see the problem list section below for additional details.  Past Medical History: Past Medical History:  Diagnosis Date   COVID-19 10/2020   Past Surgical History: Past Surgical History:  Procedure Laterality Date   APPENDECTOMY     Family History: Family History  Problem Relation Age of Onset   Healthy Mother    Healthy Father    Prostate cancer Father 79 - 51   Healthy Brother    Healthy Daughter    Seizures Daughter    Healthy Daughter    Allergies: No Known Allergies Health Maintenance: Health Maintenance  Topic Date Due   Hepatitis C Screening  Never done   COLONOSCOPY (Pts 45-30yr Insurance coverage will need to be confirmed)  Never done   COVID-19 Vaccine (3 - 2023-24 season) 01/10/2023 (Originally 06/20/2022)   INFLUENZA VACCINE  01/18/2023 (Originally 05/20/2022)   DTaP/Tdap/Td (2 - Td or Tdap) 01/25/2031   HIV Screening  Completed   HPV VACCINES  Aged Out    HM Colonoscopy          Overdue - COLONOSCOPY (Pts 45-475yrInsurance coverage will need to be confirmed) (Every 10 Years) Overdue - never done    No completion history exists for this topic.           Medications: No current outpatient medications on file prior to visit.   No current facility-administered medications on file prior to visit.    Review of Systems: No headache, visual changes, nausea, vomiting, diarrhea, constipation, dizziness, abdominal pain, skin rash, fevers, chills, night sweats, swollen lymph nodes, weight loss, chest pain, body aches, joint swelling, muscle aches, shortness of breath,  mood changes, visual or auditory hallucinations reported.  Objective:   Vitals:   12/25/22 1504  BP: 130/80  Pulse: 82  SpO2: 98%   Vitals:   12/25/22 1504  Weight: 134 lb (60.8 kg)  Height: '5\' 7"'$  (1.702 m)   Body mass index is 20.99 kg/m.  General: Well Developed, well nourished, and in no acute distress.  Neuro: Alert and oriented x3, extra-ocular muscles intact, sensation grossly intact. Cranial nerves II through XII are grossly intact, motor, sensory, and coordinative functions are intact. HEENT: Normocephalic, atraumatic, pupils equal round reactive to light, neck supple, no masses, no lymphadenopathy, thyroid nonpalpable. Oropharynx, nasopharynx, external ear canals are unremarkable. Skin: Warm and dry, no rashes noted.  Cardiac: Regular rate and rhythm, no murmurs rubs or gallops. No peripheral edema. Pulses symmetric. Respiratory: Clear to auscultation bilaterally. Not using accessory muscles, speaking in full sentences.  Abdominal: Soft, nontender, nondistended, positive bowel sounds, no masses, no organomegaly. Musculoskeletal: Shoulder, elbow, wrist, hip, knee, ankle stable, and with full range of motion.   Impression and Recommendations:   The patient was counselled, risk factors were discussed, and anticipatory guidance given.  Problem List Items Addressed This Visit       Other   Tobacco abuse    Smoking cessation counseling x 5 minutes, agreed to start Wellbutrin x 3 months.Reevaluation at that time.      Colon cancer screening    Colonoscopy referral placed.  Relevant Orders   Ambulatory referral to Gastroenterology   Annual physical exam - Primary    Annual examination completed, risk stratification labs ordered, anticipatory guidance provided.  We will follow labs once resulted.      Relevant Orders   CBC   Comprehensive metabolic panel   Hepatitis C antibody   HIV Antibody (routine testing w rflx)   Lipid panel   TSH   VITAMIN D 25  Hydroxy (Vit-D Deficiency, Fractures)   PSA Total (Reflex To Free)   Other Visit Diagnoses     Screening for HIV (human immunodeficiency virus)       Relevant Orders   HIV Antibody (routine testing w rflx)   Need for hepatitis C screening test       Relevant Orders   Hepatitis C antibody   Screening for lipoid disorders       Relevant Orders   Comprehensive metabolic panel   Lipid panel   Vitamin D deficiency       Relevant Orders   VITAMIN D 25 Hydroxy (Vit-D Deficiency, Fractures)   Prostate cancer screening       Relevant Orders   PSA Total (Reflex To Free)        Orders & Medications Medications:  Meds ordered this encounter  Medications   buPROPion (WELLBUTRIN SR) 150 MG 12 hr tablet    Sig: Take 150 mg PO once daily for 3 days then increase to 150 mg PO twice daily. Plan to stop smoking 7 days after starting medication.    Dispense:  180 tablet    Refill:  0   Orders Placed This Encounter  Procedures   CBC   Comprehensive metabolic panel   Hepatitis C antibody   HIV Antibody (routine testing w rflx)   Lipid panel   TSH   VITAMIN D 25 Hydroxy (Vit-D Deficiency, Fractures)   PSA Total (Reflex To Free)   Ambulatory referral to Gastroenterology     No follow-ups on file.    Montel Culver, MD, [facility]   Primary Care Sports Medicine Primary Care and Sports Medicine at [facility]

## 2022-12-28 NOTE — Assessment & Plan Note (Signed)
Annual examination completed, risk stratification labs ordered, anticipatory guidance provided.  We will follow labs once resulted. 

## 2022-12-28 NOTE — Patient Instructions (Signed)
-   Obtain fasting labs with orders provided (can have water or black coffee but otherwise no food or drink x 8 hours before labs) - Review information provided - Attend eye doctor annually, dentist every 6 months, work towards or maintain 30 minutes of moderate intensity physical activity at least 5 days per week, and consume a balanced diet - Return in 3 months - Contact us for any questions between now and then  Additionally: - Start Wellbutrin

## 2023-01-01 ENCOUNTER — Encounter: Payer: Self-pay | Admitting: *Deleted

## 2023-03-27 ENCOUNTER — Ambulatory Visit: Payer: BC Managed Care – PPO | Admitting: Family Medicine
